# Patient Record
Sex: Female | Born: 1990 | Hispanic: No | Marital: Single | Smoking: Former smoker
Health system: Southern US, Community
[De-identification: ages and names within clinical notes are randomized; demographics above are authoritative.]

## PROBLEM LIST (undated history)

## (undated) ENCOUNTER — Inpatient Hospital Stay (HOSPITAL_COMMUNITY): Payer: Self-pay

## (undated) DIAGNOSIS — A609 Anogenital herpesviral infection, unspecified: Secondary | ICD-10-CM

## (undated) DIAGNOSIS — Z789 Other specified health status: Secondary | ICD-10-CM

## (undated) HISTORY — PX: INDUCED ABORTION: SHX677

## (undated) HISTORY — PX: ANTERIOR CRUCIATE LIGAMENT REPAIR: SHX115

## (undated) HISTORY — DX: Anogenital herpesviral infection, unspecified: A60.9

---

## 2014-07-09 ENCOUNTER — Encounter (HOSPITAL_COMMUNITY): Payer: Self-pay | Admitting: Emergency Medicine

## 2014-07-09 ENCOUNTER — Emergency Department (HOSPITAL_COMMUNITY)
Admission: EM | Admit: 2014-07-09 | Discharge: 2014-07-09 | Disposition: A | Discharge status: Home / Self Care | Payer: No Typology Code available for payment source | Attending: Emergency Medicine | Admitting: Emergency Medicine

## 2014-07-09 DIAGNOSIS — Y9389 Activity, other specified: Secondary | ICD-10-CM | POA: Diagnosis not present

## 2014-07-09 DIAGNOSIS — S0990XA Unspecified injury of head, initial encounter: Secondary | ICD-10-CM | POA: Insufficient documentation

## 2014-07-09 DIAGNOSIS — Y92410 Unspecified street and highway as the place of occurrence of the external cause: Secondary | ICD-10-CM | POA: Insufficient documentation

## 2014-07-09 DIAGNOSIS — R51 Headache: Secondary | ICD-10-CM

## 2014-07-09 DIAGNOSIS — Z720 Tobacco use: Secondary | ICD-10-CM | POA: Insufficient documentation

## 2014-07-09 DIAGNOSIS — R519 Headache, unspecified: Secondary | ICD-10-CM

## 2014-07-09 DIAGNOSIS — Y998 Other external cause status: Secondary | ICD-10-CM | POA: Insufficient documentation

## 2014-07-09 MED ORDER — TRAMADOL HCL 50 MG PO TABS: 50.0000 mg | ORAL_TABLET | Freq: Four times a day (QID) | ORAL | Status: DC | PRN

## 2014-07-09 MED ORDER — CYCLOBENZAPRINE HCL 10 MG PO TABS: 10.0000 mg | ORAL_TABLET | Freq: Two times a day (BID) | ORAL | Status: DC | PRN

## 2014-07-09 MED ORDER — IBUPROFEN 600 MG PO TABS: 600.0000 mg | ORAL_TABLET | Freq: Four times a day (QID) | ORAL | Status: DC | PRN
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
## 2014-11-18 ENCOUNTER — Encounter (HOSPITAL_COMMUNITY): Payer: Self-pay | Admitting: *Deleted

## 2014-11-18 ENCOUNTER — Emergency Department (HOSPITAL_COMMUNITY)
Admission: EM | Admit: 2014-11-18 | Discharge: 2014-11-18 | Disposition: A | Discharge status: Home / Self Care | Payer: 59 | Source: Home / Self Care | Attending: Family Medicine | Admitting: Family Medicine

## 2014-11-18 DIAGNOSIS — L089 Local infection of the skin and subcutaneous tissue, unspecified: Secondary | ICD-10-CM

## 2014-11-18 MED ORDER — SULFAMETHOXAZOLE-TRIMETHOPRIM 800-160 MG PO TABS: 1.0000 | ORAL_TABLET | Freq: Two times a day (BID) | ORAL | Status: AC

## 2014-11-18 NOTE — Discharge Instructions (Signed)
Wound Infection °A wound infection happens when a type of germ (bacteria) starts growing in the wound. In some cases, this can cause the wound to break open. If cared for properly, the infected wound will heal from the inside to the outside. Wound infections need treatment. °CAUSES °An infection is caused by bacteria growing in the wound.  °SYMPTOMS  °· Increase in redness, swelling, or pain at the wound site. °· Increase in drainage at the wound site. °· Wound or bandage (dressing) starts to smell bad. °· Fever. °· Feeling tired or fatigued. °· Pus draining from the wound. °TREATMENT  °Your health care provider will prescribe antibiotic medicine. The wound infection should improve within 24 to 48 hours. Any redness around the wound should stop spreading and the wound should be less painful.  °HOME CARE INSTRUCTIONS  °· Only take over-the-counter or prescription medicines for pain, discomfort, or fever as directed by your health care provider. °· Take your antibiotics as directed. Finish them even if you start to feel better. °· Gently wash the area with mild soap and water 2 times a day, or as directed. Rinse off the soap. Pat the area dry with a clean towel. Do not rub the wound. This may cause bleeding. °· Follow your health care provider's instructions for how often you need to change the dressing. °· Apply ointment and a dressing to the wound as directed. °· If the dressing sticks, moisten it with soapy water and gently remove it. °· Change the bandage right away if it becomes wet, dirty, or develops a bad smell. °· Take showers. Do not take tub baths, swim, or do anything that may soak the wound until it is healed. °· Avoid exercises that make you sweat heavily. °· Use anti-itch medicine as directed by your health care provider. The wound may itch when it is healing. Do not pick or scratch at the wound. °· Follow up with your health care provider to get your wound rechecked as directed. °SEEK MEDICAL CARE  IF: °· You have an increase in swelling, pain, or redness around the wound. °· You have an increase in the amount of pus coming from the wound. °· There is a bad smell coming from the wound. °· More of the wound breaks open. °· You have a fever. °MAKE SURE YOU:  °· Understand these instructions. °· Will watch your condition. °· Will get help right away if you are not doing well or get worse. °Document Released: 01/19/2003 Document Revised: 04/27/2013 Document Reviewed: 08/26/2010 °ExitCare® Patient Information ©2015 [employer]. This information is not intended to replace advice given to you by your health care provider. Make sure you discuss any questions you have with your health care provider. ° °

## 2014-11-18 NOTE — ED Provider Notes (Signed)
CSN: [id number]     Arrival date & time 11/18/14  1300 History   First MD Initiated Contact with Patient 11/18/14 1319     Chief Complaint  Patient presents with  . Abscess   (Consider location/radiation/quality/duration/timing/severity/associated sxs/prior Treatment) Patient is a 24 y.o. female presenting with abscess. The history is provided by the patient. No language interpreter was used.  Abscess Location:  Shoulder/arm Shoulder/arm abscess location:  L shoulder and L arm Size:  2cm Abscess quality: draining, redness and warmth   Red streaking: no   Duration:  3 days Progression:  Worsening Chronicity:  New Relieved by:  Nothing Worsened by:  Nothing tried Ineffective treatments:  None tried Associated symptoms: no fever     History reviewed. No pertinent past medical history. Past Surgical History  Procedure Laterality Date  . Anterior cruciate ligament repair     History reviewed. No pertinent family history. History  Substance Use Topics  . Smoking status: Current Every Day Smoker -- 0.50 packs/day    Types: Cigarettes  . Smokeless tobacco: Never Used  . Alcohol Use: Yes     Comment: occ   OB History    No data available     Review of Systems  Constitutional: Negative for fever.  Skin: Positive for wound.  All other systems reviewed and are negative.   Allergies  Review of patient's allergies indicates no known allergies.  Home Medications   Prior to Admission medications   Medication Sig Start Date End Date Taking? Authorizing Provider  cyclobenzaprine (FLEXERIL) 10 MG tablet Take 1 tablet (10 mg total) by mouth 2 (two) times daily as needed for muscle spasms. 07/09/14   Junius FinnerErin O'Malley, PA-C  ibuprofen (ADVIL,MOTRIN) 600 MG tablet Take 1 tablet (600 mg total) by mouth every 6 (six) hours as needed. 07/09/14   Junius FinnerErin O'Malley, PA-C  sulfamethoxazole-trimethoprim (BACTRIM DS,SEPTRA DS) 800-160 MG per tablet Take 1 tablet by mouth 2 (two) times daily. 11/18/14  11/25/14  Elson AreasLeslie K Sofia, PA-C  traMADol (ULTRAM) 50 MG tablet Take 1 tablet (50 mg total) by mouth every 6 (six) hours as needed. 07/09/14   Junius FinnerErin O'Malley, PA-C   BP 144/75 mmHg  Pulse 83  Temp(Src) 98.3 F (36.8 C) (Oral)  Resp 12  SpO2 100%  LMP 11/13/2014 Physical Exam  Constitutional: She is oriented to person, place, and time. She appears well-developed and well-nourished.  HENT:  Head: Normocephalic.  Eyes: EOM are normal.  Neck: Normal range of motion.  Pulmonary/Chest: Effort normal.  Abdominal: She exhibits no distension.  Musculoskeletal: Normal range of motion.  Neurological: She is alert and oriented to person, place, and time.  Skin: There is erythema.  1 2cm swollen red area left arm,  Multiple small pustulesscattered on arms  Psychiatric: She has a normal mood and affect.  Nursing note and vitals reviewed.   ED Course  Procedures (including critical care time) Labs Review Labs Reviewed - No data to display  Imaging Review No results found.   MDM   1. Skin infection    Bactrim Return if any problems.  Lonia SkinnerLeslie K [place] AlbanySofia, PA-C 11/18/14 1352

## 2014-11-18 NOTE — ED Notes (Signed)
Pt  Has  A   Boil/ abcess     L  Upper  Arm           X  3  Days  -      Also    Reports   Symptoms   Of  Smaller  Bumps  To     The  r   Arm          As  Well   Pt  Had  Tattoos   Of  Both arms  Done    About  12  Days  Ago

## 2015-05-01 ENCOUNTER — Encounter (HOSPITAL_COMMUNITY): Payer: Self-pay | Admitting: *Deleted

## 2015-05-01 ENCOUNTER — Emergency Department (HOSPITAL_COMMUNITY)
Admission: EM | Admit: 2015-05-01 | Discharge: 2015-05-01 | Disposition: A | Discharge status: Home / Self Care | Payer: 59 | Attending: Emergency Medicine | Admitting: Emergency Medicine

## 2015-05-01 DIAGNOSIS — S199XXA Unspecified injury of neck, initial encounter: Secondary | ICD-10-CM | POA: Insufficient documentation

## 2015-05-01 DIAGNOSIS — F17210 Nicotine dependence, cigarettes, uncomplicated: Secondary | ICD-10-CM | POA: Insufficient documentation

## 2015-05-01 DIAGNOSIS — Y9389 Activity, other specified: Secondary | ICD-10-CM | POA: Insufficient documentation

## 2015-05-01 DIAGNOSIS — Y92410 Unspecified street and highway as the place of occurrence of the external cause: Secondary | ICD-10-CM | POA: Insufficient documentation

## 2015-05-01 DIAGNOSIS — Y998 Other external cause status: Secondary | ICD-10-CM | POA: Insufficient documentation

## 2015-05-01 DIAGNOSIS — M791 Myalgia, unspecified site: Secondary | ICD-10-CM

## 2015-05-01 MED ORDER — IBUPROFEN 400 MG PO TABS
600.0000 mg | ORAL_TABLET | Freq: Once | ORAL | Status: AC
  Administered 2015-05-01: 600 mg via ORAL
  Filled 2015-05-01: qty 1

## 2015-05-01 MED ORDER — CYCLOBENZAPRINE HCL 10 MG PO TABS: 10.0000 mg | ORAL_TABLET | Freq: Two times a day (BID) | ORAL | Status: DC | PRN
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
## 2015-05-01 NOTE — ED Notes (Signed)
Declined W/C at D/C and was escorted to lobby by RN. 
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
## 2015-11-05 ENCOUNTER — Emergency Department (HOSPITAL_COMMUNITY)
Admission: EM | Admit: 2015-11-05 | Discharge: 2015-11-05 | Disposition: A | Discharge status: Home / Self Care | Payer: No Typology Code available for payment source | Attending: Emergency Medicine | Admitting: Emergency Medicine

## 2015-11-05 ENCOUNTER — Encounter (HOSPITAL_COMMUNITY): Payer: Self-pay | Admitting: *Deleted

## 2015-11-05 DIAGNOSIS — F17210 Nicotine dependence, cigarettes, uncomplicated: Secondary | ICD-10-CM | POA: Insufficient documentation

## 2015-11-05 DIAGNOSIS — Z79899 Other long term (current) drug therapy: Secondary | ICD-10-CM | POA: Insufficient documentation

## 2015-11-05 DIAGNOSIS — N750 Cyst of Bartholin's gland: Secondary | ICD-10-CM | POA: Insufficient documentation

## 2015-11-05 DIAGNOSIS — A599 Trichomoniasis, unspecified: Secondary | ICD-10-CM | POA: Insufficient documentation

## 2015-11-05 LAB — URINALYSIS, ROUTINE W REFLEX MICROSCOPIC
BILIRUBIN URINE: NEGATIVE
Glucose, UA: NEGATIVE mg/dL
Hgb urine dipstick: NEGATIVE
KETONES UR: NEGATIVE mg/dL
Nitrite: NEGATIVE
Protein, ur: NEGATIVE mg/dL
Specific Gravity, Urine: 1.021 (ref 1.005–1.030)
pH: 6.5 (ref 5.0–8.0)

## 2015-11-05 LAB — WET PREP, GENITAL
Clue Cells Wet Prep HPF POC: NONE SEEN
Sperm: NONE SEEN
YEAST WET PREP: NONE SEEN

## 2015-11-05 LAB — URINE MICROSCOPIC-ADD ON

## 2015-11-05 LAB — POC URINE PREG, ED: Preg Test, Ur: NEGATIVE

## 2015-11-05 MED ORDER — AZITHROMYCIN 500 MG PO TABS: 1000.0000 mg | ORAL_TABLET | Freq: Once | ORAL | Status: DC

## 2015-11-05 MED ORDER — LIDOCAINE HCL (PF) 1 % IJ SOLN
0.9000 mL | Freq: Once | INTRAMUSCULAR | Status: DC
  Filled 2015-11-05: qty 5

## 2015-11-05 MED ORDER — CEFTRIAXONE SODIUM 250 MG IJ SOLR
250.0000 mg | Freq: Once | INTRAMUSCULAR | Status: AC
  Administered 2015-11-05: 250 mg via INTRAMUSCULAR
  Filled 2015-11-05: qty 250

## 2015-11-05 MED ORDER — LIDOCAINE HCL (PF) 1 % IJ SOLN
0.9000 mL | Freq: Once | INTRAMUSCULAR | Status: AC
  Administered 2015-11-05: 0.9 mL
  Filled 2015-11-05: qty 2

## 2015-11-05 MED ORDER — METRONIDAZOLE 500 MG PO TABS
2000.0000 mg | ORAL_TABLET | Freq: Once | ORAL | Status: AC
  Administered 2015-11-05: 2000 mg via ORAL
  Filled 2015-11-05: qty 4

## 2015-11-05 NOTE — ED Provider Notes (Signed)
CSN: [id number]     Arrival date & time 11/05/15  [id number] History   First MD Initiated Contact with Patient 11/05/15 1008     Chief Complaint  Patient presents with  . Vaginal Discharge   (Consider location/radiation/quality/duration/timing/severity/associated sxs/prior Treatment) HPI 25 y.o. female presents to the Emergency Department today complaining of vaginal discharge and swelling x 4-5 days. States discharge is white in coloration. Pain worsened with sitting down. Pain is 7/10. Pain lessened with ambulation. Has taken Ibuprofen with relief of pain. Pt is sexually active with one partner. LMP Last month. Has not had any fevers. No N/V/D. No CP/SOB/ABD pain. No headaches. No other symptoms noted.   History reviewed. No pertinent past medical history. Past Surgical History  Procedure Laterality Date  . Anterior cruciate ligament repair     No family history on file. Social History  Substance Use Topics  . Smoking status: Current Every Day Smoker -- 0.50 packs/day    Types: Cigarettes  . Smokeless tobacco: Never Used  . Alcohol Use: Yes     Comment: occ   OB History    No data available     Review of Systems ROS reviewed and all are negative for acute change except as noted in the HPI.  Allergies  Review of patient's allergies indicates no known allergies.  Home Medications   Prior to Admission medications   Medication Sig Start Date End Date Taking? Authorizing Provider  cyclobenzaprine (FLEXERIL) 10 MG tablet Take 1 tablet (10 mg total) by mouth 2 (two) times daily as needed for muscle spasms. 07/09/14   Junius FinnerErin O'Malley, PA-C  cyclobenzaprine (FLEXERIL) 10 MG tablet Take 1 tablet (10 mg total) by mouth 2 (two) times daily as needed for muscle spasms. 05/01/15   Samantha Tripp Dowless, PA-C  ibuprofen (ADVIL,MOTRIN) 600 MG tablet Take 1 tablet (600 mg total) by mouth every 6 (six) hours as needed. 07/09/14   Junius FinnerErin O'Malley, PA-C  traMADol (ULTRAM) 50 MG tablet Take 1 tablet (50 mg  total) by mouth every 6 (six) hours as needed. 07/09/14   Junius FinnerErin O'Malley, PA-C   BP 127/73 mmHg  Pulse 75  Temp(Src) 97.6 F (36.4 C) (Oral)  Resp 18  SpO2 100%  LMP 10/23/2015   Physical Exam  Constitutional: She is oriented to person, place, and time. She appears well-developed and well-nourished.  HENT:  Head: Normocephalic and atraumatic.  Eyes: EOM are normal. Pupils are equal, round, and reactive to light.  Neck: Normal range of motion. Neck supple. No tracheal deviation present.  Cardiovascular: Normal rate, regular rhythm, normal heart sounds and intact distal pulses.   No murmur heard. Pulmonary/Chest: Effort normal and breath sounds normal. No respiratory distress. She has no wheezes. She has no rales. She exhibits no tenderness.  Abdominal: Soft. Normal appearance and bowel sounds are normal. There is no tenderness. There is no rigidity, no rebound, no guarding, no tenderness at McBurney's point and negative Murphy's sign.  Genitourinary: Vagina normal and uterus normal. There is no tenderness on the right labia. There is no tenderness on the left labia. Cervix exhibits discharge (white). Cervix exhibits no motion tenderness and no friability. Right adnexum displays no mass, no tenderness and no fullness. Left adnexum displays no mass, no tenderness and no fullness.  Unilateral (left) swelling of labia. Non TTP. No erythema. No drainage.   Musculoskeletal: Normal range of motion.  Neurological: She is alert and oriented to person, place, and time.  Skin: Skin is warm and dry.  Psychiatric:  She has a normal mood and affect. Her behavior is normal. Thought content normal.  Nursing note and vitals reviewed.  ED Course  Procedures (including critical care time) Labs Review Labs Reviewed  WET PREP, GENITAL - Abnormal; Notable for the following:    Trich, Wet Prep PRESENT (*)    WBC, Wet Prep HPF POC MANY (*)    All other components within normal limits  URINALYSIS, ROUTINE W  REFLEX MICROSCOPIC (NOT AT [facility]) - Abnormal; Notable for the following:    APPearance CLOUDY (*)    Leukocytes, UA SMALL (*)    All other [facility] Care[facility]onents within normal limits  URINE MICROSCOPIC-ADD ON - Abnormal; Notable for the following:    Squamous Epithelial / LPF 0-5 (*)    Bacteria, UA FEW (*)    All other components within normal limits  POC URINE PREG, ED  GC/CHLAMYDIA PROBE AMP ([place]) NOT AT [facility]   Imaging Review No results found. I have personally reviewed and evaluated these images and lab results as part of my medical decision-making.   EKG Interpretation None     MDM  I have reviewed and evaluated the relevant laboratory values I have reviewed the relevant previous healthcare records. I obtained HPI from historian.  ED Course:  Assessment: Pt is a 25yF who presents with vaginal discharge x 4-5 days. On exam, pt in NAD. Nontoxic/nonseptic appearing. VSS. Afebrile. Lungs CTA. Heart RRR. Abdomen nontender soft. Pelvic exam with white discharge present. Unilateral labial swelling of left side. Non TTP. No erythema Wet Prep showed WBC and Trichamonas. Will treat with Flagyl 2g/Rocephin 250. Given Rx Azithro. UA unremarkable. Preg negative. Plan is to DC home with follow [facility] to [facility] for bartholin cyst. At time of discharge, Patient is in no acute distress. Vital Signs are stable. Patient is able to ambulate. Patient able to tolerate PO.    Disposition/Plan:  Dc Home Additional Verbal discharge instructions given and discussed with patient.  Pt Instructed to [facility] the next week for evaluation and treatment of symptoms. Return precautions given Pt acknowledges and agrees with plan  Supervising Physician Doug SouSam Jacubowitz, MD    Final diagnoses:  Trichimoniasis  Bartholin cyst     Audry Piliyler Tykeria Wawrzyniak, PA-C 11/05/15 1155  Doug SouSam Jacubowitz, MD 11/05/15 [phone]

## 2015-11-05 NOTE — ED Notes (Signed)
Lidocaine requested from pharmacy

## 2015-11-05 NOTE — ED Notes (Signed)
Pt presents via POV c/o vaginal swelling and pain x 4-5 days.  Reports white discharge as well.  A x 4, NAD.

## 2015-11-05 NOTE — Discharge Instructions (Signed)
Please read and follow all provided instructions.  Your diagnoses today include:  1. Trichimoniasis   2. Bartholin cyst    Tests performed today include:  Test for gonorrhea and chlamydia. You will be notified by telephone if you have a positive result.  Vital signs. See below for your results today.   Medications:  You were treated for chlamydia (1 gram azithromycin pills) and gonorrhea (250mg  rocephin shot). I have given you Flagyl as well in the ED.   Home care instructions:  Read educational materials contained in this packet and follow any instructions provided.   You should tell your partners about your infection and avoid having sex for one week to allow time for the medicine to work.  Follow-up instructions: You should [facility] to be tested for HIV, syphilis, and hepatitis -- all of which can be transmitted by sexual contact. We do not routinely screen for these in the Emergency Department.  STD Testing:  [facility] [place] [place] [facility]  [address], [place], phone [phone] or [phone]    Monday - Friday, call for an appointment  [facility] [place] [place] [facility]  [address], [place], phone [phone] or [phone]   Monday - Friday, call for an appointment  Return instructions:   Please return to the Emergency Department if you experience worsening symptoms.   Please return if you have any other emergent concerns.  Additional Information:  Your vital signs today were: BP 127/73 mmHg   Pulse 75   Temp(Src) 97.6 F (36.4 C) (Oral)   Resp 18   SpO2 100%   LMP 10/23/2015 If your blood pressure (BP) was elevated above 135/85 this visit, please have this repeated by your doctor within one month. --------------

## 2015-11-06 LAB — GC/CHLAMYDIA PROBE AMP (~~LOC~~) NOT AT ARMC
Chlamydia: NEGATIVE
Neisseria Gonorrhea: POSITIVE — AB

## 2015-11-07 ENCOUNTER — Telehealth (HOSPITAL_BASED_OUTPATIENT_CLINIC_OR_DEPARTMENT_OTHER): Payer: Self-pay | Admitting: Emergency Medicine

## 2015-11-09 ENCOUNTER — Telehealth: Payer: Self-pay | Admitting: *Deleted

## 2015-11-12 ENCOUNTER — Telehealth (HOSPITAL_BASED_OUTPATIENT_CLINIC_OR_DEPARTMENT_OTHER): Payer: Self-pay

## 2016-02-28 ENCOUNTER — Encounter (HOSPITAL_COMMUNITY): Payer: Self-pay

## 2016-02-28 ENCOUNTER — Inpatient Hospital Stay (HOSPITAL_COMMUNITY)
Admission: AD | Admit: 2016-02-28 | Discharge: 2016-02-28 | Disposition: A | Discharge status: Home / Self Care | Payer: Self-pay | Attending: Family Medicine | Admitting: Family Medicine

## 2016-02-28 ENCOUNTER — Inpatient Hospital Stay (HOSPITAL_COMMUNITY)
Admission: AD | Admit: 2016-02-28 | Discharge: 2016-02-28 | Disposition: A | Discharge status: Home / Self Care | Payer: Self-pay | Source: Ambulatory Visit | Attending: Family Medicine | Admitting: Family Medicine

## 2016-02-28 DIAGNOSIS — N390 Urinary tract infection, site not specified: Secondary | ICD-10-CM | POA: Insufficient documentation

## 2016-02-28 DIAGNOSIS — R112 Nausea with vomiting, unspecified: Secondary | ICD-10-CM | POA: Insufficient documentation

## 2016-02-28 DIAGNOSIS — F17210 Nicotine dependence, cigarettes, uncomplicated: Secondary | ICD-10-CM | POA: Insufficient documentation

## 2016-02-28 DIAGNOSIS — R1030 Lower abdominal pain, unspecified: Secondary | ICD-10-CM | POA: Insufficient documentation

## 2016-02-28 DIAGNOSIS — Z79899 Other long term (current) drug therapy: Secondary | ICD-10-CM | POA: Insufficient documentation

## 2016-02-28 LAB — WET PREP, GENITAL
Clue Cells Wet Prep HPF POC: NONE SEEN
SPERM: NONE SEEN
TRICH WET PREP: NONE SEEN
YEAST WET PREP: NONE SEEN

## 2016-02-28 LAB — COMPREHENSIVE METABOLIC PANEL
ALT: 14 U/L (ref 14–54)
AST: 18 U/L (ref 15–41)
Albumin: 4.2 g/dL (ref 3.5–5.0)
Alkaline Phosphatase: 69 U/L (ref 38–126)
Anion gap: 9 (ref 5–15)
BUN: 10 mg/dL (ref 6–20)
CHLORIDE: 100 mmol/L — AB (ref 101–111)
CO2: 28 mmol/L (ref 22–32)
CREATININE: 0.71 mg/dL (ref 0.44–1.00)
Calcium: 9.3 mg/dL (ref 8.9–10.3)
GFR calc non Af Amer: >60 mL/min (ref >60)
Glucose, Bld: 119 mg/dL — ABNORMAL HIGH (ref 65–99)
Potassium: 3.5 mmol/L (ref 3.5–5.1)
SODIUM: 137 mmol/L (ref 135–145)
Total Bilirubin: 0.4 mg/dL (ref 0.3–1.2)
Total Protein: 7.8 g/dL (ref 6.5–8.1)

## 2016-02-28 LAB — CBC
HCT: 39.2 % (ref 36.0–46.0)
Hemoglobin: 13.6 g/dL (ref 12.0–15.0)
MCH: 30.6 pg (ref 26.0–34.0)
MCHC: 34.7 g/dL (ref 30.0–36.0)
MCV: 88.3 fL (ref 78.0–100.0)
PLATELETS: 290 10*3/uL (ref 150–400)
RBC: 4.44 MIL/uL (ref 3.87–5.11)
RDW: 13.1 % (ref 11.5–15.5)
WBC: 9.3 10*3/uL (ref 4.0–10.5)

## 2016-02-28 LAB — URINALYSIS, ROUTINE W REFLEX MICROSCOPIC
BILIRUBIN URINE: NEGATIVE
Glucose, UA: NEGATIVE mg/dL
Ketones, ur: 15 mg/dL — AB
Nitrite: NEGATIVE
PH: 7 (ref 5.0–8.0)
Protein, ur: 30 mg/dL — AB
Specific Gravity, Urine: 1.02 (ref 1.005–1.030)

## 2016-02-28 LAB — URINE MICROSCOPIC-ADD ON

## 2016-02-28 LAB — POCT PREGNANCY, URINE: Preg Test, Ur: NEGATIVE

## 2016-02-28 MED ORDER — PROMETHAZINE HCL 25 MG PO TABS: 12.5000 mg | ORAL_TABLET | Freq: Four times a day (QID) | ORAL | 0 refills | Status: DC | PRN

## 2016-02-28 MED ORDER — SULFAMETHOXAZOLE-TRIMETHOPRIM 800-160 MG PO TABS: 1.0000 | ORAL_TABLET | Freq: Two times a day (BID) | ORAL | 0 refills | Status: DC

## 2016-02-28 NOTE — MAU Provider Note (Signed)
Chief Complaint: No chief complaint on file.   First Provider Initiated Contact with Patient 02/28/16 1751      SUBJECTIVE HPI: Shannon Frye is a 25 y.o. G1P0010 who presents to maternity admissions reporting onset of lower abdominal cramping pain and n/v 3-4 days ago, worsening today. She also reports irregular menses this month with 3 periods in last 30 days.  She usually has regular periods. She has not tried any treatments, the symptoms are all new onset, intermittent, and resolving spontaneously. She denies pain or nausea now in MAU. She denies vaginal itching/burning, urinary symptoms, h/a, dizziness, or fever/chills.     HPI  History reviewed. No pertinent past medical history. Past Surgical History:  Procedure Laterality Date  . ANTERIOR CRUCIATE LIGAMENT REPAIR     Social History   Social History  . Marital status: Single    Spouse name: N/A  . Number of children: N/A  . Years of education: N/A   Occupational History  . Not on file.   Social History Main Topics  . Smoking status: Current Every Day Smoker    Packs/day: 0.50    Types: Cigarettes  . Smokeless tobacco: Never Used  . Alcohol use Yes     Comment: occ  . Drug use:     Types: Marijuana     Comment: used today  . Sexual activity: Yes    Birth control/ protection: Condom   Other Topics Concern  . Not on file   Social History Narrative  . No narrative on file   No current facility-administered medications on file prior to encounter.    Current Outpatient Prescriptions on File Prior to Encounter  Medication Sig Dispense Refill  . ibuprofen (ADVIL,MOTRIN) 200 MG tablet Take 400 mg by mouth every 8 (eight) hours as needed for mild pain or moderate pain.    Marland Kitchen azithromycin (ZITHROMAX) 500 MG tablet Take 2 tablets (1,000 mg total) by mouth once. Take 2 tablets together (Patient not taking: Reported on 02/28/2016) 2 tablet 0   No Known Allergies  ROS:  Review of Systems  Constitutional: Negative  for chills, fatigue and fever.  Respiratory: Negative for shortness of breath.   Cardiovascular: Negative for chest pain.  Gastrointestinal: Positive for abdominal pain.  Genitourinary: Positive for pelvic pain and vaginal bleeding. Negative for difficulty urinating, dysuria, flank pain, vaginal discharge and vaginal pain.  Neurological: Negative for dizziness and headaches.  Psychiatric/Behavioral: Negative.      I have reviewed patient's Past Medical Hx, Surgical Hx, Family Hx, Social Hx, medications and allergies.   Physical Exam  Patient Vitals for the past 24 hrs:  BP Temp Temp src Pulse Resp SpO2 Height Weight  02/28/16 1824 129/78 98.1 F (36.7 C) Oral 94 16 - - -  02/28/16 1532 121/72 98.5 F (36.9 C) Oral (!) 50 17 100 % 5\' 2"  (1.575 m) 106 lb (48.1 kg)   Constitutional: Well-developed, well-nourished female in no acute distress.  Cardiovascular: normal rate Respiratory: normal effort GI: Abd soft, non-tender. Pos BS x 4 MS: Extremities nontender, no edema, normal ROM Neurologic: Alert and oriented x 4.  GU: Neg CVAT.  PELVIC EXAM: wet prep and GCC collected by blind swab Bimanual exam: Cervix 0/long/high, firm, anterior, neg CMT, uterus nontender, nonenlarged, adnexa without tenderness, enlargement, or mass   LAB RESULTS Results for orders placed or performed during the hospital encounter of 02/28/16 (from the past 24 hour(s))  Urinalysis, Routine w reflex microscopic (not at [facility])  Status: Abnormal   Collection Time: 02/28/16  3:35 PM  Result Value Ref Range   Color, Urine YELLOW YELLOW   APPearance HAZY (A) CLEAR   Specific Gravity, Urine 1.020 1.005 - 1.030   pH 7.0 5.0 - 8.0   Glucose, UA NEGATIVE NEGATIVE mg/dL   Hgb urine dipstick SMALL (A) NEGATIVE   Bilirubin Urine NEGATIVE NEGATIVE   Ketones, ur 15 (A) NEGATIVE mg/dL   Protein, ur 30 (A) NEGATIVE mg/dL   Nitrite NEGATIVE NEGATIVE   Leukocytes, UA MODERATE (A) NEGATIVE  Urine microscopic-add on      Status: Abnormal   Collection Time: 02/28/16  3:35 PM  Result Value Ref Range   Squamous Epithelial / LPF 0-5 (A) NONE SEEN   WBC, UA 6-30 0 - 5 WBC/hpf   RBC / HPF 0-5 0 - 5 RBC/hpf   Bacteria, UA RARE (A) NONE SEEN   Crystals CA OXALATE CRYSTALS (A) NEGATIVE   Urine-Other MUCOUS PRESENT   Pregnancy, urine POC     Status: None   Collection Time: 02/28/16  4:04 PM  Result Value Ref Range   Preg Test, Ur NEGATIVE NEGATIVE  CBC     Status: None   Collection Time: 02/28/16  5:23 PM  Result Value Ref Range   WBC 9.3 4.0 - 10.5 K/uL   RBC 4.44 3.87 - 5.11 MIL/uL   Hemoglobin 13.6 12.0 - 15.0 g/dL   HCT 16.139.2 09.636.0 - 04.546.0 %   MCV 88.3 78.0 - 100.0 fL   MCH 30.6 26.0 - 34.0 pg   MCHC 34.7 30.0 - 36.0 g/dL   RDW 40.913.1 81.111.5 - 91.415.5 %   Platelets 290 150 - 400 K/uL  Comprehensive metabolic panel     Status: Abnormal   Collection Time: 02/28/16  5:23 PM  Result Value Ref Range   Sodium 137 135 - 145 mmol/L   Potassium 3.5 3.5 - 5.1 mmol/L   Chloride 100 (L) 101 - 111 mmol/L   CO2 28 22 - 32 mmol/L   Glucose, Bld 119 (H) 65 - 99 mg/dL   BUN 10 6 - 20 mg/dL   Creatinine, Ser 7.820.71 0.44 - 1.00 mg/dL   Calcium 9.3 8.9 - 95.610.3 mg/dL   Total Protein 7.8 6.5 - 8.1 g/dL   Albumin 4.2 3.5 - 5.0 g/dL   AST 18 15 - 41 U/L   ALT 14 14 - 54 U/L   Alkaline Phosphatase 69 38 - 126 U/L   Total Bilirubin 0.4 0.3 - 1.2 mg/dL   GFR calc non Af Amer >60 >60 mL/min   GFR calc Af Amer >60 >60 mL/min   Anion gap 9 5 - 15  Wet prep, genital     Status: Abnormal   Collection Time: 02/28/16  5:40 PM  Result Value Ref Range   Yeast Wet Prep HPF POC NONE SEEN NONE SEEN   Trich, Wet Prep NONE SEEN NONE SEEN   Clue Cells Wet Prep HPF POC NONE SEEN NONE SEEN   WBC, Wet Prep HPF POC MODERATE (A) NONE SEEN   Sperm NONE SEEN        IMAGING No results found.  MAU Management/MDM: Ordered labs and reviewed results.  No evidence of acute abdomen or systemic infection.  Will treat for UTI, urine sent for  culture.  Bactrim DS BID x 3 days, Phenergan 12.5-25 mg PO Q 6 hours PRN.  [facility] for Gyn concerns, MAU for emergencies.  Pt stable at time of  discharge.  ASSESSMENT 1. Urinary tract infection without hematuria, site unspecified   2. Non-intractable vomiting with nausea, unspecified vomiting type     PLAN Discharge home   Medication List    STOP taking these medications   azithromycin 500 MG tablet Commonly known as:  ZITHROMAX     TAKE these medications   ibuprofen 200 MG tablet Commonly known as:  ADVIL,MOTRIN Take 400 mg by mouth every 8 (eight) hours as needed for mild pain or moderate pain.   promethazine 25 MG tablet Commonly known as:  PHENERGAN Take 0.5-1 tablets (12.5-25 mg total) by mouth every 6 (six) hours as needed for nausea.   sulfamethoxazole-trimethoprim 800-160 MG tablet Commonly known as:  BACTRIM DS,SEPTRA DS Take 1 tablet by mouth 2 (two) times daily.      Follow-up Information    Center for [facility]. Schedule an appointment as soon as possible for a visit today.   Specialty:  Obstetrics and Gynecology Why:  As needed for Gyn care, return to MAU as needed for emergencies Contact information: [address] [place] [address] [phone]          Sharen Counter Certified Nurse-Midwife 02/28/2016  6:29 PM

## 2016-02-28 NOTE — Discharge Instructions (Signed)

## 2016-02-28 NOTE — MAU Note (Signed)
Pt c/o abdominal pain starting Saturday and continuing until today. Pt states she has been having night sweats. Pt states she hasn't been able to keep anything down since last night.Pt states she vomits everything she eats. Pt Pt states her periods have been irregular. Pt states she has been having more than one period in a month. Pt states sometimes her periods are really heavy.

## 2016-02-29 LAB — GC/CHLAMYDIA PROBE AMP (~~LOC~~) NOT AT ARMC
Chlamydia: NEGATIVE
NEISSERIA GONORRHEA: POSITIVE — AB

## 2016-02-29 LAB — URINE CULTURE

## 2016-03-03 ENCOUNTER — Telehealth (HOSPITAL_COMMUNITY): Payer: Self-pay | Admitting: Advanced Practice Midwife

## 2016-03-03 NOTE — Telephone Encounter (Signed)
Left message to call back for positive Gonorrhea result Report [facility]

## 2016-03-04 ENCOUNTER — Telehealth (HOSPITAL_COMMUNITY): Payer: Self-pay | Admitting: *Deleted

## 2016-03-19 ENCOUNTER — Ambulatory Visit: Payer: Self-pay | Admitting: Family Medicine

## 2016-06-23 ENCOUNTER — Encounter (HOSPITAL_COMMUNITY): Payer: Self-pay

## 2016-06-23 ENCOUNTER — Inpatient Hospital Stay (HOSPITAL_COMMUNITY)
Admission: AD | Admit: 2016-06-23 | Discharge: 2016-06-23 | Disposition: A | Discharge status: Home / Self Care | Payer: Self-pay | Source: Ambulatory Visit | Attending: Obstetrics & Gynecology | Admitting: Obstetrics & Gynecology

## 2016-06-23 DIAGNOSIS — Z30015 Encounter for initial prescription of vaginal ring hormonal contraceptive: Secondary | ICD-10-CM | POA: Insufficient documentation

## 2016-06-23 DIAGNOSIS — Z202 Contact with and (suspected) exposure to infections with a predominantly sexual mode of transmission: Secondary | ICD-10-CM | POA: Insufficient documentation

## 2016-06-23 DIAGNOSIS — F17210 Nicotine dependence, cigarettes, uncomplicated: Secondary | ICD-10-CM | POA: Insufficient documentation

## 2016-06-23 LAB — URINALYSIS, ROUTINE W REFLEX MICROSCOPIC
Bilirubin Urine: NEGATIVE
Glucose, UA: NEGATIVE mg/dL
Ketones, ur: 15 mg/dL — AB
Leukocytes, UA: NEGATIVE
Nitrite: NEGATIVE
Protein, ur: NEGATIVE mg/dL
Specific Gravity, Urine: >1.03 — ABNORMAL HIGH (ref 1.005–1.030)
pH: 6 (ref 5.0–8.0)

## 2016-06-23 LAB — URINALYSIS, MICROSCOPIC (REFLEX)

## 2016-06-23 LAB — WET PREP, GENITAL
CLUE CELLS WET PREP: NONE SEEN
SPERM: NONE SEEN
TRICH WET PREP: NONE SEEN
YEAST WET PREP: NONE SEEN

## 2016-06-23 LAB — POCT PREGNANCY, URINE: Preg Test, Ur: NEGATIVE

## 2016-06-23 MED ORDER — ETONOGESTREL-ETHINYL ESTRADIOL 0.12-0.015 MG/24HR VA RING: VAGINAL_RING | VAGINAL | 12 refills | Status: DC

## 2016-06-23 MED ORDER — PENICILLIN G BENZATHINE 1200000 UNIT/2ML IM SUSP
2.4000 10*6.[IU] | Freq: Once | INTRAMUSCULAR | Status: AC
Start: 2016-06-23 — End: 2016-06-23
  Administered 2016-06-23: 2.4 10*6.[IU] via INTRAMUSCULAR
  Filled 2016-06-23: qty 4

## 2016-06-23 NOTE — MAU Provider Note (Signed)
Chief Complaint: Exposure to STD   SUBJECTIVE HPI: Shannon Frye is a 26 y.o. G1P0010 at Unknown who presents to Maternity Admissions reporting exposure to STI (syphilis).  Patient states her boyfriend told her today that he tested positive for syphilis and she needs to be treated. Patient states she denies any ulcers or lesions, denies VB or VD. Patient is requesting to have all STI panel done. She also is not on contraception, but desires to start. She would like the Depo shot but also interested in the NuvaRing. Her last unprotected intercourse was 2 weeks ago, with LMP 2 weeks ago.     History reviewed. No pertinent past medical history. OB History  Gravida Para Term Preterm AB Living  1       1    SAB TAB Ectopic Multiple Live Births    1          # Outcome Date GA Lbr Len/2nd Weight Sex Delivery Anes PTL Lv  1 TAB              Past Surgical History:  Procedure Laterality Date  . ANTERIOR CRUCIATE LIGAMENT REPAIR     Social History   Social History  . Marital status: Single    Spouse name: N/A  . Number of children: N/A  . Years of education: N/A   Occupational History  . Not on file.   Social History Main Topics  . Smoking status: Current Every Day Smoker    Packs/day: 0.50    Types: Cigarettes  . Smokeless tobacco: Never Used  . Alcohol use Yes     Comment: occ  . Drug use: Yes    Types: Marijuana     Comment: used today  . Sexual activity: Yes    Birth control/ protection: Condom   Other Topics Concern  . Not on file   Social History Narrative  . No narrative on file   No current facility-administered medications on file prior to encounter.    Current Outpatient Prescriptions on File Prior to Encounter  Medication Sig Dispense Refill  . ibuprofen (ADVIL,MOTRIN) 200 MG tablet Take 400 mg by mouth every 8 (eight) hours as needed for mild pain or moderate pain.     No Known Allergies  I have reviewed the past Medical Hx, Surgical Hx, Social Hx,  Allergies and Medications.   REVIEW OF SYSTEMS  RESPIRATORY: no cough, shortness of breath, or wheezing CARDIOVASCULAR: no chest pain or dyspnea on exertion GASTROINTESTINAL: no abdominal pain, change in bowel habits, or black or bloody stools GENITO-URINARY: no dysuria, trouble voiding, or hematuria negative for - genital discharge, vulvar/vaginal symptoms or vaginal bleeding MUSKULOSKELETAL: negative for - gait disturbance or swelling in ankle - bilateral, foot - bilateral and leg - bilateral NEUROLOGICAL: negative for - dizziness, gait disturbance, headaches, numbness/tingling or visual changes DERMATOLOGICAL: negative   OBJECTIVE Patient Vitals for the past 24 hrs:  BP Temp Temp src Pulse Resp  06/23/16 1054 119/66 97.9 F (36.6 C) Oral 60 18    PHYSICAL EXAM Constitutional: Well-developed, well-nourished female in no acute distress.  Cardiovascular: normal rate, rhythm, no murmurs Respiratory: normal rate and effort. CTAB GI: Abd soft, non-tender, non-distended. Pos BS x 4 MS: Extremities nontender, no edema, normal ROM Neurologic: Alert and oriented x 4.  GU: Neg CVAT. SPECULUM EXAM: NEFG- no lesions, physiologic discharge, no blood noted, cervix clean BIMANUAL: cervix closed; uterus normal size, no adnexal tenderness or masses. No CMT.  LAB RESULTS Results for orders  placed or performed during the hospital encounter of 06/23/16 (from the past 24 hour(s))  Pregnancy, urine POC     Status: None   Collection Time: 06/23/16 10:50 AM  Result Value Ref Range   Preg Test, Ur NEGATIVE NEGATIVE    IMAGING No results found.  MAU COURSE UPT negative SSE/SVE Pen G IM 2.4 million units  MDM Plan of care reviewed with patient, including labs and tests ordered and medical treatment. Discussed all birth control options, patient elects to have NuvaRing, will follow up with health department for repeat testing in 6 months and for birth control if wants to switch to Depo.     ASSESSMENT 1. Exposure to syphilis   2. Encounter for initial prescription of vaginal ring hormonal contraceptive     PLAN Discharge home in stable condition. Contraception choice: NuvaRing Follow up with health department for repeat testing in 6 months  Follow-up Information    [facility]. Schedule an appointment as soon as possible for a visit in 6 month(s).   Why:  Or sooner. Repeat STD testing. Also follow up for management of contraception. Contact information: [address] [place] [id number] [phone]          Allergies as of 06/23/2016   No Known Allergies     Medication List    TAKE these medications   etonogestrel-ethinyl estradiol 0.12-0.015 MG/24HR vaginal ring Commonly known as:  NUVARING Insert vaginally and leave in place for 3 consecutive weeks, then remove for 1 week.   ibuprofen 200 MG tablet Commonly known as:  ADVIL,MOTRIN Take 400 mg by mouth every 8 (eight) hours as needed for mild pain or moderate pain.        Jen MowElizabeth Magdaleno Lortie, DO OB Fellow 06/23/2016 11:26 AM

## 2016-06-23 NOTE — Discharge Instructions (Signed)
Syphilis Syphilis is an infectious disease. It can cause serious complications if left untreated. What are the causes? Syphilis is caused by a type of bacteria called Treponema pallidum. It is most commonly spread through sexual contact. Syphilis may also spread to a fetus through the blood of the mother. What are the signs or symptoms? Symptoms vary depending on the stage of the disease. Some symptoms may disappear without treatment. However, this does not mean that the infection is gone. One form of syphilis (called latent syphilis) has no symptoms. Primary Syphilis  Painless sores (chancres) in and around the genital organs and mouth.  Swollen lymph nodes near the sores. Secondary Syphilis  A rash or sores over any portion of the body, including the palms of the hands and soles of the feet.  Fever.  Headache.  Sore throat.  Swollen lymph nodes.  New sores in the mouth or on the genitals.  Feeling generally ill.  Having pain in the joints. Tertiary Syphilis  The third stage of syphilis involves severe damage to different organs in the body, such as the brain, spinal cord, and heart. Signs and symptoms may include:  Dementia.  Personality and mood changes.  Difficulty walking.  Heart failure.  Fainting.  Enlargement (aneurysm) of the aorta.  Tumors of the skin, bones, or liver.  Muscle weakness.  Sudden lightning pains, numbness, or tingling.  Problems with coordination.  Vision changes. How is this diagnosed?  A physical exam will be done.  Blood tests will be done to confirm the diagnosis.  If the disease is in the first or second stages, a fluid (drainage) sample from a sore or rash may be examined under a microscope to detect the disease-causing bacteria.  Fluid around the spine may need to be examined to detect brain damage or inflammation of the brain lining (meningitis).  If the disease is in the third stage, X-rays, CT scans, MRIs,  echocardiograms, ultrasounds, or cardiac catheterization may also be done to detect disease of the heart, aorta, or brain. How is this treated? Syphilis can be cured with antibiotic medicine if a diagnosis is made early. During the first day of treatment, you may experience fever, chills, headache, nausea, or aching all over your body. This is a normal reaction to the antibiotics. Follow these instructions at home:  Take your antibiotic medicine as directed by your health care provider. Finish the antibiotic even if you start to feel better. Incomplete treatment will put you at risk for continued infection and could be life threatening.  Take medicines only as directed by your health care provider.  Do not have sexual intercourse until your treatment is completed or as directed by your health care provider.  Inform your recent sexual partners that you were diagnosed with syphilis. They need to seek care and treatment, even if they have no symptoms. It is necessary that all your sexual partners be tested for infection and treated if they have the disease.  Keep all follow-up visits as directed by your health care provider. It is important to keep all your appointments.  If your test results are not ready during your visit, make an appointment with your health care provider to find out the results. Do not assume everything is normal if you have not heard from your health care provider or the medical facility. It is your responsibility to get your test results. Contact a health care provider if:  You continue to have any of the following 24 hours after beginning  treatment:  Fever.  Chills.  Headache.  Nausea.  Aching all over your body.  You have symptoms of an allergic reaction to medicine, such as:  Chills.  A headache.  Light-headedness.  A new rash (especially hives).  Difficulty breathing. This information is not intended to replace advice given to you by your health care  provider. Make sure you discuss any questions you have with your health care provider. Document Released: 02/10/2013 Document Revised: 09/28/2015 Document Reviewed: 11/10/2012 Elsevier Interactive Patient Education  2017 [employer] Inc[employer]  Estradiol vaginal ring (Estring) What is this medicine? ESTRADIOL (es tra DYE ole) vaginal ring is an insert that contains a female hormone. This medicine helps relieve symptoms of vaginal irritation and dryness that occurs in some women during menopause. This medicine may be used for other purposes; ask your health care provider or pharmacist if you have questions. COMMON BRAND NAME(S): Estring What should I tell my health care provider before I take this medicine? They need to know if you have any of these conditions: -abnormal vaginal bleeding -blood vessel disease or blood clots -breast, cervical, endometrial, ovarian, liver, or uterine cancer -dementia -diabetes -gallbladder disease -heart disease or recent heart attack -high blood pressure -high cholesterol -high level of calcium in the blood -hysterectomy -kidney disease -liver disease -migraine headaches -protein C deficiency -protein S deficiency -stroke -systemic lupus erythematosus (SLE) -tobacco smoker -an unusual or allergic reaction to estrogens, other hormones, medicines, foods, dyes, or preservatives -pregnant or trying to get pregnant -breast-feeding How should I use this medicine? This medicine may be inserted by you or your physician. Follow the directions that are included with your prescription. If you are unsure how to insert the ring, contact your doctor or health care professional. The vaginal ring should remain in place for 90 days. After 90 days you should replace your old ring and insert a new one. Do not stop using except on the advice of your doctor or health care professional. Contact your pediatrician regarding the use of this medicine in children. Special care may be  needed. A patient package insert for the product will be given with each prescription and refill. Read this sheet carefully each time. The sheet may change frequently. Overdosage: If you think you have taken too much of this medicine contact a poison control center or emergency room at once. NOTE: This medicine is only for you. Do not share this medicine with others. What if I miss a dose? If you miss a dose, use it as soon as you can. If it is almost time for your next dose, use only that dose. Do not use double or extra doses. What may interact with this medicine? Do not take this medicine with any of the following medications: -aromatase inhibitors like aminoglutethimide, anastrozole, exemestane, letrozole, testolactone, vorozole This medicine may also interact with the following medications: -carbamazepine -certain antibiotics used to treat infections -certain barbiturates used for inducing sleep or treating seizures -grapefruit juice -medicines for fungus infections like itraconazole and ketoconazole -raloxifene or tamoxifen -rifabutin, rifampin, or rifapentine -ritonavir -St. John's Wort This list may not describe all possible interactions. Give your health care provider a list of all the medicines, herbs, non-prescription drugs, or dietary supplements you use. Also tell them if you smoke, drink alcohol, or use illegal drugs. Some items may interact with your medicine. What should I watch for while using this medicine? Visit your doctor or health care professional for regular checks on your progress. You will need a regular  breast and pelvic exam and Pap smear while on this medicine. You should also discuss the need for regular mammograms with your health care professional, and follow his or her guidelines for these tests. This medicine can make your body retain fluid, making your fingers, hands, or ankles swell. Your blood pressure can go up. Contact your doctor or health care  professional if you feel you are retaining fluid. If you have any reason to think you are pregnant, stop taking this medicine right away and contact your doctor or health care professional. Smoking increases the risk of getting a blood clot or having a stroke while you are taking this medicine, especially if you are more than 25 years old. You are strongly advised not to smoke. If you wear contact lenses and notice visual changes, or if the lenses begin to feel uncomfortable, consult your eye doctor or health care professional. This medicine can increase the risk of developing a condition (endometrial hyperplasia) that may lead to cancer of the lining of the uterus. Taking progestins, another hormone drug, with this medicine lowers the risk of developing this condition. Therefore, if your uterus has not been removed (by a hysterectomy), your doctor may prescribe a progestin for you to take together with your estrogen. You should know, however, that taking estrogens with progestins may have additional health risks. You should discuss the use of estrogens and progestins with your health care professional to determine the benefits and risks for you. If you are going to have surgery, you may need to stop taking this medicine. Consult your health care professional for advice before you schedule the surgery. You may bathe or participate in other activities while using this medicine. You do not need to remove the vaginal ring during sexual or other activities unless you are more comfortable doing so. Within the 90-day dosage period, you may remove the vaginal ring, rinse it with clean lukewarm (not hot or boiling) water, and re-insert the ring as needed. What side effects may I notice from receiving this medicine? Side effects that you should report to your doctor or health care professional as soon as possible: -allergic reactions like skin rash, itching or hives, swelling of the face, lips, or tongue -breast  tissue changes or discharge -signs and symptoms of a blood clot such as breathing problems; changes in vision; chest pain; severe, sudden headache; pain, swelling, warmth in the leg; trouble speaking; sudden numbness or weakness of the face, arm or leg -signs and symptoms of infection like fever or chills; vomiting; diarrhea; muscle pain; dizziness; or a red, sunburn-like rash on face and body -signs and symptoms of liver injury like dark yellow or brown urine; general ill feeling or flu-like symptoms; light-colored stools; loss of appetite; nausea; right upper belly pain; unusually weak or tired; yellowing of the eyes or skin -symptoms of bowel blockage like constipation, abdominal swelling, abdominal pain, inability to pass gas or have a bowel movement -symptoms of vaginal infection like itching, irritation or unusual discharge -unusual or increased vaginal bleeding -vaginal pain or soreness, redness, swelling Side effects that usually do not require medical attention (report to your doctor or health care professional if they continue or are bothersome): -breast tenderness -fluid retention -hair loss -headache -nausea -upset stomach -vaginal spotting This list may not describe all possible side effects. Call your doctor for medical advice about side effects. You may report side effects to FDA at 1-800-FDA-1088. Where should I keep my medicine? Keep out of the reach of  children. Store at room temperature between 15 and 25 degrees C (59 and 77 degrees F). Throw away any unused medicine after the expiration date. NOTE: This sheet is a summary. It may not cover all possible information. If you have questions about this medicine, talk to your doctor, pharmacist, or health care provider.  2017 Elsevier/Gold Standard (2014-02-14 13:20:25)   Contraception Choices Birth control (contraception) is the use of any methods or devices to stop pregnancy from happening. Below are some methods to help  avoid pregnancy. Hormonal birth control  A small tube put under the skin of the upper arm (implant). The tube can stay in place for 3 years. The implant must be taken out after 3 years.  Shots given every 3 months.  Pills taken every day.  Patches that are changed once a week.  A ring put into the vagina (vaginal ring). The ring is left in place for 3 weeks and removed for 1 week. Then, a new ring is put in the vagina.  Emergency birth control pills taken after unprotected sex (intercourse). Barrier birth control  A thin covering worn on the penis (female condom) during sex.  A soft, loose covering put into the vagina (female condom) before sex.  A rubber bowl that sits over the cervix (diaphragm). The bowl must be made for you. The bowl is put into the vagina before sex. The bowl is left in place for 6 to 8 hours after sex.  A small, soft cup that fits over the cervix (cervical cap). The cup must be made for you. The cup can be left in place for 48 hours after sex.  A sponge that is put into the vagina before sex.  A chemical that kills or stops sperm from getting into the cervix and uterus (spermicide). The chemical may be a cream, jelly, foam, or pill. Intrauterine (IUD) birth control  IUD birth control is a small, T-shaped piece of plastic. The plastic is put inside the uterus. There are 2 types of IUD:  Copper IUD. The IUD is covered in copper wire. The copper makes a fluid that kills sperm. It can stay in place for 10 years.  Hormone IUD. The hormone stops pregnancy from happening. It can stay in place for 5 years. Permanent methods  When the woman has her fallopian tubes sealed, tied, or blocked during surgery. This stops the egg from traveling to the uterus.  The doctor places a small coil or insert into each fallopian tube. This causes scar tissue to form and blocks the fallopian tubes.  When the female has the tubes that carry sperm tied off (vasectomy). Natural family  planning birth control  Natural family planning means not having sex or using barrier birth control on the days the woman could become pregnant.  Use a calendar to keep track of the length of each period and know the days she can get pregnant.  Avoid sex during ovulation.  Use a thermometer to measure body temperature. Also watch for symptoms of ovulation.  Time sex to be after the woman has ovulated. Use condoms to help protect yourself against sexually transmitted infections (STIs). Do this no matter what type of birth control you use. Talk to your doctor about which type of birth control is best for you. This information is not intended to replace advice given to you by your health care provider. Make sure you discuss any questions you have with your health care provider. Document Released: 02/17/2009 Document Revised: 09/28/2015  Document Reviewed: 11/11/2012 Elsevier Interactive Patient Education  2017 [employer]

## 2016-06-23 NOTE — MAU Note (Signed)
Pt presents to MAU for STD testing. Pt reports her ex boyfriend told her to get tested due to him testing positive for Syphilis. Pt denies any abnormal discharge or urinary symptoms.

## 2016-06-24 LAB — GC/CHLAMYDIA PROBE AMP (~~LOC~~) NOT AT ARMC
CHLAMYDIA, DNA PROBE: NEGATIVE
Neisseria Gonorrhea: NEGATIVE

## 2016-06-24 LAB — HIV ANTIBODY (ROUTINE TESTING W REFLEX): HIV Screen 4th Generation wRfx: NONREACTIVE

## 2016-06-24 LAB — RPR: RPR: NONREACTIVE

## 2017-01-23 ENCOUNTER — Ambulatory Visit (INDEPENDENT_AMBULATORY_CARE_PROVIDER_SITE_OTHER): Payer: Medicaid Other | Admitting: Obstetrics

## 2017-01-23 ENCOUNTER — Other Ambulatory Visit (HOSPITAL_COMMUNITY)
Admission: RE | Admit: 2017-01-23 | Discharge: 2017-01-23 | Disposition: A | Discharge status: Home / Self Care | Payer: Medicaid Other | Source: Ambulatory Visit | Attending: Obstetrics | Admitting: Obstetrics

## 2017-01-23 ENCOUNTER — Encounter: Payer: Self-pay | Admitting: Obstetrics

## 2017-01-23 ENCOUNTER — Telehealth: Payer: Self-pay

## 2017-01-23 VITALS — BP 125/82 | HR 59 | Wt 114.6 lb

## 2017-01-23 DIAGNOSIS — Z3482 Encounter for supervision of other normal pregnancy, second trimester: Secondary | ICD-10-CM | POA: Diagnosis not present

## 2017-01-23 DIAGNOSIS — F17210 Nicotine dependence, cigarettes, uncomplicated: Secondary | ICD-10-CM

## 2017-01-23 DIAGNOSIS — Z3480 Encounter for supervision of other normal pregnancy, unspecified trimester: Secondary | ICD-10-CM

## 2017-01-23 MED ORDER — PRENATE MINI 29-0.6-0.4-350 MG PO CAPS: 1.0000 | ORAL_CAPSULE | Freq: Every day | ORAL | 3 refills | Status: AC

## 2017-01-23 NOTE — Progress Notes (Signed)
Subjective:    Shannon Frye is being seen today for her first obstetrical visit.  This is not a planned pregnancy. She is at [redacted]w[redacted]d gestation. Her obstetrical history is significant for smoker. Relationship with FOB: significant other, not living together. Patient does intend to breast feed. Pregnancy history fully reviewed.  The information documented in the HPI was reviewed and verified.  Menstrual History: OB History    Gravida Para Term Preterm AB Living   2       1     SAB TAB Ectopic Multiple Live Births     1             Patient's last menstrual period was 09/19/2016.    History reviewed. No pertinent past medical history.  Past Surgical History:  Procedure Laterality Date  . ANTERIOR CRUCIATE LIGAMENT REPAIR       (Not in a hospital admission) No Known Allergies  Social History  Substance Use Topics  . Smoking status: Current Every Day Smoker    Packs/day: 0.50  . Smokeless tobacco: Never Used  . Alcohol use Yes     Comment: occ    Family History  Problem Relation Age of Onset  . Diabetes Mother      Review of Systems Constitutional: negative for weight loss Gastrointestinal: negative for vomiting Genitourinary:negative for genital lesions and vaginal discharge and dysuria Musculoskeletal:negative for back pain Behavioral/Psych: negative for abusive relationship, depression, illegal drug usage and tobacco use    Objective:    BP 125/82   Pulse (!) 59   Wt 114 lb 9.6 oz (52 kg)   LMP 09/19/2016   BMI 20.96 kg/m  General Appearance:    Alert, cooperative, no distress, appears stated age  Head:    Normocephalic, without obvious abnormality, atraumatic  Eyes:    PERRL, conjunctiva/corneas clear, EOM's intact, fundi    benign, both eyes  Ears:    Normal TM's and external ear canals, both ears  Nose:   Nares normal, septum midline, mucosa normal, no drainage    or sinus tenderness  Throat:   Lips, mucosa, and tongue normal; teeth and gums normal   Neck:   Supple, symmetrical, trachea midline, no adenopathy;    thyroid:  no enlargement/tenderness/nodules; no carotid   bruit or JVD  Back:     Symmetric, no curvature, ROM normal, no CVA tenderness  Lungs:     Clear to auscultation bilaterally, respirations unlabored  Chest Wall:    No tenderness or deformity   Heart:    Regular rate and rhythm, S1 and S2 normal, no murmur, rub   or gallop  Breast Exam:    No tenderness, masses, or nipple abnormality  Abdomen:     Soft, non-tender, bowel sounds active all four quadrants,    no masses, no organomegaly  Genitalia:    Normal female without lesion, discharge or tenderness  Extremities:   Extremities normal, atraumatic, no cyanosis or edema  Pulses:   2+ and symmetric all extremities  Skin:   Skin color, texture, turgor normal, no rashes or lesions  Lymph nodes:   Cervical, supraclavicular, and axillary nodes normal  Neurologic:   CNII-XII intact, normal strength, sensation and reflexes    throughout      Lab Review Urine pregnancy test Labs reviewed yes Radiologic studies reviewed yes  Assessment:    Pregnancy at [redacted]w[redacted]d weeks    Plan:     1. Supervision of other normal pregnancy, antepartum Rx: - Cytology -  PAP - Cervicovaginal ancillary only - Culture, OB Urine - Hemoglobinopathy evaluation - Vitamin D (25 hydroxy) - Hemoglobin A1c - Varicella zoster antibody, IgG - Enroll Patient in Babyscripts - Prenat w/o A-FeCbn-Meth-FA-DHA (PRENATE MINI) 29-0.6-0.4-350 MG CAPS; Take 1 capsule by mouth daily before breakfast.  Dispense: 90 capsule; Refill: 3 - [place] [facility] OB COMP + 14 WK; Future  2. Tobacco dependence due to cigarettes   Prenatal vitamins.  Counseling provided regarding continued use of seat belts, cessation of alcohol consumption, smoking or use of illicit drugs; infection precautions i.e., influenza/TDAP immunizations, toxoplasmosis,CMV, parvovirus, listeria and varicella; workplace safety, exercise during pregnancy;  routine dental care, safe medications, sexual activity, hot tubs, saunas, pools, travel, caffeine use, fish and methlymercury, potential toxins, hair treatments, varicose veins Weight gain recommendations per IOM guidelines reviewed: underweight/BMI< 18.5--> gain 28 - 40 lbs; normal weight/BMI 18.5 - 24.9--> gain 25 - 35 lbs; overweight/BMI 25 - 29.9--> gain 15 - 25 lbs; obese/BMI >30->gain  11 - 20 lbs Problem list reviewed and updated. FIRST/CF mutation testing/NIPT/QUAD SCREEN/fragile X/Ashkenazi Jewish population testing/Spinal muscular atrophy discussed: requested. Role of ultrasound in pregnancy discussed; fetal survey: requested. Amniocentesis discussed: not indicated  Meds ordered this encounter  Medications  . Prenatal Vit-Fe Fumarate-FA (PRENATAL MULTIVITAMIN) TABS tablet    Sig: Take 1 tablet by mouth daily at 12 noon.  . Prenat w/o A-FeCbn-Meth-FA-DHA (PRENATE MINI) 29-0.6-0.4-350 MG CAPS    Sig: Take 1 capsule by mouth daily before breakfast.    Dispense:  90 capsule    Refill:  3   Orders Placed This Encounter  Procedures  . Culture, OB Urine  . [place] MFM OB COMP + 14 WK    Standing Status:   Future    Standing Expiration Date:   03/25/2018    Order Specific Question:   Reason for Exam (SYMPTOM  OR DIAGNOSIS REQUIRED)    Answer:   Anatomy    Order Specific Question:   Preferred imaging location?    Answer:   [facility]-Ultrasound  . Hemoglobinopathy evaluation  . Vitamin D (25 hydroxy)  . Hemoglobin A1c  . Varicella zoster antibody, IgG    Follow up in 4 weeks. 50% of 20 min visit spent on counseling and coordination of care. Patient is in the office for initial ob visit, states that she is unsure if she feels movement or not, no complaints.

## 2017-01-23 NOTE — Telephone Encounter (Signed)
Contacted pt because she left [facility] appt without getting labs done, woman answered and stated that she would have pt call back.

## 2017-01-23 NOTE — Addendum Note (Signed)
Addended by: Natale Milch D on: 01/23/2017 04:28 PM   Modules accepted: Orders

## 2017-01-24 ENCOUNTER — Inpatient Hospital Stay (HOSPITAL_COMMUNITY)
Admission: AD | Admit: 2017-01-24 | Discharge: 2017-01-25 | Disposition: A | Discharge status: Home / Self Care | Payer: Medicaid Other | Source: Ambulatory Visit | Attending: Obstetrics and Gynecology | Admitting: Obstetrics and Gynecology

## 2017-01-24 ENCOUNTER — Other Ambulatory Visit: Payer: Self-pay | Admitting: Obstetrics

## 2017-01-24 DIAGNOSIS — Z3A18 18 weeks gestation of pregnancy: Secondary | ICD-10-CM

## 2017-01-24 DIAGNOSIS — O9989 Other specified diseases and conditions complicating pregnancy, childbirth and the puerperium: Secondary | ICD-10-CM | POA: Diagnosis not present

## 2017-01-24 DIAGNOSIS — O99332 Smoking (tobacco) complicating pregnancy, second trimester: Secondary | ICD-10-CM | POA: Insufficient documentation

## 2017-01-24 DIAGNOSIS — Z202 Contact with and (suspected) exposure to infections with a predominantly sexual mode of transmission: Secondary | ICD-10-CM

## 2017-01-24 DIAGNOSIS — A5901 Trichomonal vulvovaginitis: Secondary | ICD-10-CM | POA: Insufficient documentation

## 2017-01-24 DIAGNOSIS — F17210 Nicotine dependence, cigarettes, uncomplicated: Secondary | ICD-10-CM | POA: Insufficient documentation

## 2017-01-24 DIAGNOSIS — Z3492 Encounter for supervision of normal pregnancy, unspecified, second trimester: Secondary | ICD-10-CM

## 2017-01-24 DIAGNOSIS — O98312 Other infections with a predominantly sexual mode of transmission complicating pregnancy, second trimester: Secondary | ICD-10-CM | POA: Insufficient documentation

## 2017-01-24 HISTORY — DX: Other specified health status: Z78.9

## 2017-01-24 LAB — CERVICOVAGINAL ANCILLARY ONLY
Bacterial vaginitis: NEGATIVE
CANDIDA VAGINITIS: NEGATIVE
CHLAMYDIA, DNA PROBE: NEGATIVE
Neisseria Gonorrhea: NEGATIVE
Trichomonas: POSITIVE — AB

## 2017-01-24 MED ORDER — METRONIDAZOLE 500 MG PO TABS: 2000.0000 mg | ORAL_TABLET | Freq: Once | ORAL | 0 refills | Status: DC

## 2017-01-24 NOTE — MAU Note (Signed)
Think I have STD. Boyfriend told me he was treated for poss herpes outbreak. I have like a rash on my vagina for 3 days. Is not painful. Some white vag d/c. No pain

## 2017-01-25 ENCOUNTER — Encounter (HOSPITAL_COMMUNITY): Payer: Self-pay | Admitting: *Deleted

## 2017-01-25 DIAGNOSIS — O9989 Other specified diseases and conditions complicating pregnancy, childbirth and the puerperium: Secondary | ICD-10-CM

## 2017-01-25 DIAGNOSIS — Z202 Contact with and (suspected) exposure to infections with a predominantly sexual mode of transmission: Secondary | ICD-10-CM | POA: Diagnosis not present

## 2017-01-25 DIAGNOSIS — Z3A18 18 weeks gestation of pregnancy: Secondary | ICD-10-CM | POA: Diagnosis not present

## 2017-01-25 LAB — URINALYSIS, ROUTINE W REFLEX MICROSCOPIC
BILIRUBIN URINE: NEGATIVE
GLUCOSE, UA: NEGATIVE mg/dL
HGB URINE DIPSTICK: NEGATIVE
Ketones, ur: NEGATIVE mg/dL
NITRITE: NEGATIVE
PROTEIN: NEGATIVE mg/dL
Specific Gravity, Urine: 1.016 (ref 1.005–1.030)
pH: 6 (ref 5.0–8.0)

## 2017-01-25 LAB — URINE CULTURE, OB REFLEX

## 2017-01-25 LAB — CULTURE, OB URINE

## 2017-01-25 MED ORDER — METRONIDAZOLE 500 MG PO TABS: 2000.0000 mg | ORAL_TABLET | Freq: Once | ORAL | 0 refills | Status: AC

## 2017-01-25 NOTE — Discharge Instructions (Signed)

## 2017-01-25 NOTE — Progress Notes (Signed)
Erin Lawrence NP in to discuss test result and d/c plan with pt. Written and verbal d/c instructions given and understanding voiced 

## 2017-01-25 NOTE — MAU Provider Note (Signed)
History     CSN: [id number]  Arrival date and time: 01/24/17 2338  First Provider Initiated Contact with Patient 01/25/17 0055      Chief Complaint  Patient presents with  . possible STD   HPI Shannon Frye is a 26 y.o. G2P0010 at [redacted]w[redacted]d who presents for STD exposure. States her BF was treated for HSV today. She has noted a possible lesion on her right labia. Denies abdominal pain, vaginal bleeding, or vaginal discharge.  OB History    Gravida Para Term Preterm AB Living   2       1     SAB TAB Ectopic Multiple Live Births     1            Past Medical History:  Diagnosis Date  . Medical history non-contributory     Past Surgical History:  Procedure Laterality Date  . ANTERIOR CRUCIATE LIGAMENT REPAIR    . INDUCED ABORTION      Family History  Problem Relation Age of Onset  . Diabetes Mother     Social History  Substance Use Topics  . Smoking status: Current Every Day Smoker    Packs/day: 0.50  . Smokeless tobacco: Never Used  . Alcohol use Yes     Comment: occ    Allergies: No Known Allergies  No prescriptions prior to admission.    Review of Systems  Gastrointestinal: Negative.   Genitourinary: Positive for genital sores. Negative for vaginal bleeding and vaginal discharge.   Physical Exam   Blood pressure 124/73, pulse 63, temperature 98.2 F (36.8 C), resp. rate 16, height  (1.575 m), weight 119 lb (54 kg), last menstrual period 09/19/2016.  Physical Exam  Nursing note and vitals reviewed. Constitutional: She is oriented to person, place, and time. She appears well-developed and well-nourished. No distress.  HENT:  Head: Normocephalic and atraumatic.  Eyes: Conjunctivae are normal. Right eye exhibits no discharge. Left eye exhibits no discharge. No scleral icterus.  Neck: Normal range of motion.  Respiratory: Effort normal. No respiratory distress.  Genitourinary: There is lesion (small ulcerated lesion of mid right labia minora) on  the right labia.  Neurological: She is alert and oriented to person, place, and time.  Skin: Skin is warm and dry. She is not diaphoretic.  Psychiatric: She has a normal mood and affect. Her behavior is normal. Judgment and thought content normal.    MAU Course  Procedures Results for orders placed or performed during the hospital encounter of 01/24/17 (from the past 24 hour(s))  Urinalysis, Routine w reflex microscopic     Status: Abnormal   Collection Time: 01/24/17 11:55 PM  Result Value Ref Range   Color, Urine YELLOW YELLOW   APPearance HAZY (A) CLEAR   Specific Gravity, Urine 1.016 1.005 - 1.030   pH 6.0 5.0 - 8.0   Glucose, UA NEGATIVE NEGATIVE mg/dL   Hgb urine dipstick NEGATIVE NEGATIVE   Bilirubin Urine NEGATIVE NEGATIVE   Ketones, ur NEGATIVE NEGATIVE mg/dL   Protein, ur NEGATIVE NEGATIVE mg/dL   Nitrite NEGATIVE NEGATIVE   Leukocytes, UA TRACE (A) NEGATIVE   RBC / HPF 0-5 0 - 5 RBC/hpf   WBC, UA 6-30 0 - 5 WBC/hpf   Bacteria, UA FEW (A) NONE SEEN   Squamous Epithelial / LPF 0-5 (A) NONE SEEN   Mucus PRESENT    Amorphous Crystal PRESENT     MDM FHT 150 Per review of records; pt had OB appt earlier this week. GC/CT  negative. Trich +. Flagyl was called into pharmacy but patient had not been notified yet. Notified pt of + trich during this visit. Patient opted to fill rx that has already been sent in for her & take tx at home.  Labial lesion x 1, HSV culture obtained  Assessment and Plan  A: 1. Fetal heart tones present, second trimester   2. Possible exposure to STD   3. [redacted] weeks gestation of pregnancy   4. Trichomonas contact, untreated    P: Discharge home Take flagyl as prescribed No intercourse x 1 week after treatment HSV culture pending  Judeth Horn 01/25/2017, 12:55 AM

## 2017-01-27 ENCOUNTER — Telehealth: Payer: Self-pay

## 2017-01-27 ENCOUNTER — Other Ambulatory Visit: Payer: Self-pay

## 2017-01-27 LAB — HSV CULTURE AND TYPING

## 2017-01-27 LAB — CYTOLOGY - PAP
Diagnosis: NEGATIVE
HPV (WINDOPATH): NOT DETECTED

## 2017-01-27 MED ORDER — METRONIDAZOLE 375 MG PO CAPS: 375.0000 mg | ORAL_CAPSULE | Freq: Two times a day (BID) | ORAL | 0 refills | Status: DC

## 2017-01-28 ENCOUNTER — Other Ambulatory Visit: Payer: Self-pay | Admitting: Obstetrics

## 2017-01-29 ENCOUNTER — Other Ambulatory Visit: Payer: Self-pay

## 2017-01-29 MED ORDER — METRONIDAZOLE 500 MG PO TABS: 1000.0000 mg | ORAL_TABLET | Freq: Once | ORAL | 0 refills | Status: AC

## 2017-01-29 NOTE — Telephone Encounter (Signed)
Patient notified of results and RX 

## 2017-01-30 ENCOUNTER — Telehealth: Payer: Self-pay | Admitting: Student

## 2017-01-30 LAB — HEMOGLOBIN A1C
Est. average glucose Bld gHb Est-mCnc: 88 mg/dL
Hgb A1c MFr Bld: 4.7 % — ABNORMAL LOW (ref 4.8–5.6)

## 2017-01-30 LAB — HEMOGLOBINOPATHY EVALUATION
HGB C: 0 %
HGB S: 0 %
HGB VARIANT: 1.5 % — AB
Hemoglobin A2 Quantitation: 1.3 % — ABNORMAL LOW (ref 1.8–3.2)
Hemoglobin F Quantitation: 0 % (ref 0.0–2.0)
Hgb A: 97.5 % (ref 96.4–98.8)

## 2017-01-30 LAB — OBSTETRIC PANEL, INCLUDING HIV
ANTIBODY SCREEN: NEGATIVE
BASOS: 0 %
Basophils Absolute: 0 10*3/uL (ref 0.0–0.2)
EOS (ABSOLUTE): 0.1 10*3/uL (ref 0.0–0.4)
Eos: 1 %
HEMATOCRIT: 34.4 % (ref 34.0–46.6)
HIV SCREEN 4TH GENERATION: NONREACTIVE
Hemoglobin: 11.7 g/dL (ref 11.1–15.9)
Hepatitis B Surface Ag: NEGATIVE
Immature Grans (Abs): 0 10*3/uL (ref 0.0–0.1)
Immature Granulocytes: 0 %
Lymphocytes Absolute: 2.6 10*3/uL (ref 0.7–3.1)
Lymphs: 36 %
MCH: 32.7 pg (ref 26.6–33.0)
MCHC: 34 g/dL (ref 31.5–35.7)
MCV: 96 fL (ref 79–97)
MONOCYTES: 5 %
MONOS ABS: 0.3 10*3/uL (ref 0.1–0.9)
NEUTROS ABS: 4.2 10*3/uL (ref 1.4–7.0)
Neutrophils: 58 %
Platelets: 308 10*3/uL (ref 150–379)
RBC: 3.58 x10E6/uL — ABNORMAL LOW (ref 3.77–5.28)
RDW: 15.1 % (ref 12.3–15.4)
RH TYPE: NEGATIVE
RPR Ser Ql: NONREACTIVE
RUBELLA: 4.42 {index} (ref >0.99)
WBC: 7.2 10*3/uL (ref 3.4–10.8)

## 2017-01-30 LAB — VARICELLA ZOSTER ANTIBODY, IGG: Varicella zoster IgG: 662 index (ref >165)

## 2017-01-30 LAB — VITAMIN D 25 HYDROXY (VIT D DEFICIENCY, FRACTURES): Vit D, 25-Hydroxy: 33.1 ng/mL (ref 30.0–100.0)

## 2017-01-30 NOTE — Telephone Encounter (Signed)
Attempted to contact pt x 2. No answer & voicemail box full.  Patient needs to be notified of +HSV results & rx treatment.  Staff message sent to her OB office to notify her at upcoming visit.

## 2017-01-31 ENCOUNTER — Ambulatory Visit (HOSPITAL_COMMUNITY)
Admission: RE | Admit: 2017-01-31 | Discharge: 2017-01-31 | Disposition: A | Discharge status: Home / Self Care | Payer: Medicaid Other | Source: Ambulatory Visit | Attending: Obstetrics | Admitting: Obstetrics

## 2017-01-31 DIAGNOSIS — Z3A19 19 weeks gestation of pregnancy: Secondary | ICD-10-CM | POA: Diagnosis not present

## 2017-01-31 DIAGNOSIS — Z3689 Encounter for other specified antenatal screening: Secondary | ICD-10-CM | POA: Diagnosis present

## 2017-01-31 DIAGNOSIS — Z3482 Encounter for supervision of other normal pregnancy, second trimester: Secondary | ICD-10-CM | POA: Diagnosis present

## 2017-01-31 DIAGNOSIS — Z3480 Encounter for supervision of other normal pregnancy, unspecified trimester: Secondary | ICD-10-CM

## 2017-02-03 ENCOUNTER — Other Ambulatory Visit: Payer: Self-pay | Admitting: Obstetrics

## 2017-02-03 DIAGNOSIS — Z09 Encounter for follow-up examination after completed treatment for conditions other than malignant neoplasm: Secondary | ICD-10-CM

## 2017-02-04 ENCOUNTER — Encounter: Payer: Self-pay | Admitting: Obstetrics and Gynecology

## 2017-02-04 ENCOUNTER — Telehealth: Payer: Self-pay

## 2017-02-04 DIAGNOSIS — O43109 Malformation of placenta, unspecified, unspecified trimester: Secondary | ICD-10-CM | POA: Insufficient documentation

## 2017-02-04 DIAGNOSIS — Q270 Congenital absence and hypoplasia of umbilical artery: Secondary | ICD-10-CM | POA: Insufficient documentation

## 2017-02-04 DIAGNOSIS — B009 Herpesviral infection, unspecified: Secondary | ICD-10-CM | POA: Insufficient documentation

## 2017-02-04 DIAGNOSIS — O98519 Other viral diseases complicating pregnancy, unspecified trimester: Secondary | ICD-10-CM

## 2017-02-04 NOTE — Telephone Encounter (Signed)
S/w patient and advised of results and need for afp tetra before 21.0 weeks, pt agreed, transferred to scheduler.

## 2017-02-05 ENCOUNTER — Other Ambulatory Visit: Payer: Medicaid Other

## 2017-02-05 DIAGNOSIS — Z3480 Encounter for supervision of other normal pregnancy, unspecified trimester: Secondary | ICD-10-CM

## 2017-02-07 LAB — AFP TETRA
DIA Mom Value: 1.03
DIA Value (EIA): 228.56 pg/mL
DSR (By Age)    1 IN: 982
DSR (Second Trimester) 1 IN: 10000
Gestational Age: 19.6 wk
MSAFP Mom: 1.33
MSAFP: 85 ng/mL
MSHCG Mom: 0.65
MSHCG: 19021 m[IU]/mL
Maternal Age At EDD: 26 a
Osb Risk: 4399
T18 (By Age): 1:3826 {titer}
Test Results:: NEGATIVE
Weight: 114 [lb_av]
uE3 Mom: 0.95
uE3 Value: 1.75 ng/mL

## 2017-02-20 ENCOUNTER — Ambulatory Visit (INDEPENDENT_AMBULATORY_CARE_PROVIDER_SITE_OTHER): Payer: Medicaid Other | Admitting: Obstetrics

## 2017-02-20 ENCOUNTER — Encounter: Payer: Self-pay | Admitting: Obstetrics

## 2017-02-20 VITALS — BP 110/68 | HR 62 | Wt 122.6 lb

## 2017-02-20 DIAGNOSIS — O43199 Other malformation of placenta, unspecified trimester: Secondary | ICD-10-CM

## 2017-02-20 DIAGNOSIS — O43892 Other placental disorders, second trimester: Secondary | ICD-10-CM

## 2017-02-20 DIAGNOSIS — Z3480 Encounter for supervision of other normal pregnancy, unspecified trimester: Secondary | ICD-10-CM

## 2017-02-20 DIAGNOSIS — O09899 Supervision of other high risk pregnancies, unspecified trimester: Secondary | ICD-10-CM

## 2017-02-20 DIAGNOSIS — O43899 Other placental disorders, unspecified trimester: Secondary | ICD-10-CM

## 2017-02-20 DIAGNOSIS — Z3482 Encounter for supervision of other normal pregnancy, second trimester: Secondary | ICD-10-CM

## 2017-02-20 NOTE — Progress Notes (Signed)
Pt states she is well at this time

## 2017-02-20 NOTE — Progress Notes (Addendum)
Subjective:  Shannon Frye is a 26 y.o. G2P0010 at 448w0d being seen today for ongoing prenatal care.  She is currently monitored for the following issues for this low-risk pregnancy and has Supervision of other normal pregnancy, antepartum; Single umbilical artery; ?bi-lobed placenta; and HSV-2 infection complicating pregnancy on her problem list.  Patient reports no complaints.  Contractions: Not present. Vag. Bleeding: None.  Movement: Present. Denies leaking of fluid.   The following portions of the patient's history were reviewed and updated as appropriate: allergies, current medications, past family history, past medical history, past social history, past surgical history and problem list. Problem list updated.  Objective:   Vitals:   02/20/17 1418  BP: 110/68  Pulse: 62  Weight: 122 lb 9.6 oz (55.6 kg)    Fetal Status: Fetal Heart Rate (bpm): 150   Movement: Present     General:  Alert, oriented and cooperative. Patient is in no acute distress.  Skin: Skin is warm and dry. No rash noted.   Cardiovascular: Normal heart rate noted  Respiratory: Normal respiratory effort, no problems with respiration noted  Abdomen: Soft, gravid, appropriate for gestational age. Pain/Pressure: Present     Pelvic:  Cervical exam deferred        Extremities: Normal range of motion.  Edema: None  Mental Status: Normal mood and affect. Normal behavior. Normal judgment and thought content.   Urinalysis:      Assessment and Plan:  Pregnancy: G2P0010 at 308w0d  1. Supervision of other normal pregnancy, antepartum   2. Single umbilical artery affecting management of mother, antepartum, single gestation.  U/S scheduled q 6 wks for growth  Rx: - US MFM OB FOLLOW UP; Future - US MFM OB FOLLOW UP; Future  3. Bilobed placenta Rx: - US MFM OB FOLLOW UP; Future - US MFM OB FOLLOW UP; Future   Preterm labor symptoms and general obstetric precautions including but not limited to vaginal bleeding,  contractions, leaking of fluid and fetal movement were reviewed in detail with the patient. Please refer to After Visit Summary for other counseling recommendations.  Return in about 4 weeks (around 03/20/2017) for ROB.   Brock BadHarper, Charles A, MD

## 2017-03-14 ENCOUNTER — Encounter (HOSPITAL_COMMUNITY): Payer: Self-pay

## 2017-03-14 ENCOUNTER — Other Ambulatory Visit: Payer: Self-pay | Admitting: Obstetrics

## 2017-03-14 ENCOUNTER — Ambulatory Visit (HOSPITAL_COMMUNITY)
Admission: RE | Admit: 2017-03-14 | Discharge: 2017-03-14 | Disposition: A | Discharge status: Home / Self Care | Payer: Medicaid Other | Source: Ambulatory Visit | Attending: Obstetrics | Admitting: Obstetrics

## 2017-03-14 DIAGNOSIS — O43199 Other malformation of placenta, unspecified trimester: Secondary | ICD-10-CM

## 2017-03-14 DIAGNOSIS — Z3A25 25 weeks gestation of pregnancy: Secondary | ICD-10-CM

## 2017-03-14 DIAGNOSIS — O09892 Supervision of other high risk pregnancies, second trimester: Secondary | ICD-10-CM | POA: Insufficient documentation

## 2017-03-14 DIAGNOSIS — O43892 Other placental disorders, second trimester: Secondary | ICD-10-CM | POA: Diagnosis not present

## 2017-03-14 DIAGNOSIS — Z362 Encounter for other antenatal screening follow-up: Secondary | ICD-10-CM

## 2017-03-14 DIAGNOSIS — O43899 Other placental disorders, unspecified trimester: Secondary | ICD-10-CM

## 2017-03-14 DIAGNOSIS — O09899 Supervision of other high risk pregnancies, unspecified trimester: Secondary | ICD-10-CM

## 2017-03-20 ENCOUNTER — Other Ambulatory Visit (HOSPITAL_COMMUNITY)
Admission: RE | Admit: 2017-03-20 | Discharge: 2017-03-20 | Disposition: A | Discharge status: Home / Self Care | Payer: Medicaid Other | Source: Ambulatory Visit | Attending: Obstetrics | Admitting: Obstetrics

## 2017-03-20 ENCOUNTER — Encounter: Payer: Self-pay | Admitting: Obstetrics

## 2017-03-20 ENCOUNTER — Ambulatory Visit (INDEPENDENT_AMBULATORY_CARE_PROVIDER_SITE_OTHER): Payer: Medicaid Other | Admitting: Obstetrics

## 2017-03-20 VITALS — BP 126/71 | HR 71 | Wt 136.8 lb

## 2017-03-20 DIAGNOSIS — Z3482 Encounter for supervision of other normal pregnancy, second trimester: Secondary | ICD-10-CM

## 2017-03-20 DIAGNOSIS — O26892 Other specified pregnancy related conditions, second trimester: Secondary | ICD-10-CM | POA: Insufficient documentation

## 2017-03-20 DIAGNOSIS — L298 Other pruritus: Secondary | ICD-10-CM | POA: Insufficient documentation

## 2017-03-20 DIAGNOSIS — Z3480 Encounter for supervision of other normal pregnancy, unspecified trimester: Secondary | ICD-10-CM

## 2017-03-20 DIAGNOSIS — Z3A26 26 weeks gestation of pregnancy: Secondary | ICD-10-CM | POA: Diagnosis not present

## 2017-03-20 DIAGNOSIS — N898 Other specified noninflammatory disorders of vagina: Secondary | ICD-10-CM

## 2017-03-20 MED ORDER — CLOTRIMAZOLE 1 % EX CREA: 1.0000 "application " | TOPICAL_CREAM | Freq: Two times a day (BID) | CUTANEOUS | 0 refills | Status: DC

## 2017-03-20 MED ORDER — FLUCONAZOLE 150 MG PO TABS: 150.0000 mg | ORAL_TABLET | Freq: Once | ORAL | 0 refills | Status: AC

## 2017-03-20 NOTE — Patient Instructions (Addendum)

## 2017-03-21 ENCOUNTER — Encounter: Payer: Self-pay | Admitting: Obstetrics

## 2017-03-21 NOTE — Progress Notes (Signed)
Subjective:  Shannon Frye is a 26 y.o. G2P0010 at 6191w1d being seen today for ongoing prenatal care.  She is currently monitored for the following issues for this low-risk pregnancy and has Supervision of other normal pregnancy, antepartum; Single umbilical artery; ?bi-lobed placenta; and HSV-2 infection complicating pregnancy on their problem list.  Patient reports vaginal irritation.  Contractions: Not present. Vag. Bleeding: None.  Movement: Present. Denies leaking of fluid.   The following portions of the patient's history were reviewed and updated as appropriate: allergies, current medications, past family history, past medical history, past social history, past surgical history and problem list. Problem list updated.  Objective:   Vitals:   03/20/17 1356  BP: 126/71  Pulse: 71  Weight: 136 lb 12.8 oz (62.1 kg)    Fetal Status: Fetal Heart Rate (bpm): 150   Movement: Present     General:  Alert, oriented and cooperative. Patient is in no acute distress.  Skin: Skin is warm and dry. No rash noted.   Cardiovascular: Normal heart rate noted  Respiratory: Normal respiratory effort, no problems with respiration noted  Abdomen: Soft, gravid, appropriate for gestational age. Pain/Pressure: Present     Pelvic:  Cervical exam deferred        Extremities: Normal range of motion.  Edema: None  Mental Status: Normal mood and affect. Normal behavior. Normal judgment and thought content.   Urinalysis:      Assessment and Plan:  Pregnancy: G2P0010 at 6391w1d  1. Supervision of other normal pregnancy, antepartum Rx: - Cervicovaginal ancillary only  2. Vagina itching Rx: - Cervicovaginal ancillary only - fluconazole (DIFLUCAN) 150 MG tablet; Take 1 tablet (150 mg total) once for 1 dose by mouth.  Dispense: 1 tablet; Refill: 0 - clotrimazole (LOTRIMIN) 1 % cream; Apply 1 application 2 (two) times daily topically.  Dispense: 60 g; Refill: 0  Preterm labor symptoms and general obstetric  precautions including but not limited to vaginal bleeding, contractions, leaking of fluid and fetal movement were reviewed in detail with the patient. Please refer to After Visit Summary for other counseling recommendations.  Return in about 2 weeks (around 04/03/2017) for ROB.   Brock BadHarper, Charles A, MD

## 2017-03-24 LAB — CERVICOVAGINAL ANCILLARY ONLY
Bacterial vaginitis: NEGATIVE
CANDIDA VAGINITIS: POSITIVE — AB
CHLAMYDIA, DNA PROBE: NEGATIVE
Neisseria Gonorrhea: NEGATIVE
Trichomonas: NEGATIVE

## 2017-03-25 ENCOUNTER — Other Ambulatory Visit: Payer: Self-pay | Admitting: Obstetrics

## 2017-03-25 DIAGNOSIS — B373 Candidiasis of vulva and vagina: Secondary | ICD-10-CM

## 2017-03-25 DIAGNOSIS — B3731 Acute candidiasis of vulva and vagina: Secondary | ICD-10-CM

## 2017-03-25 MED ORDER — FLUCONAZOLE 150 MG PO TABS: 150.0000 mg | ORAL_TABLET | Freq: Once | ORAL | 0 refills | Status: AC

## 2017-04-03 ENCOUNTER — Other Ambulatory Visit: Payer: Medicaid Other

## 2017-04-03 ENCOUNTER — Ambulatory Visit (INDEPENDENT_AMBULATORY_CARE_PROVIDER_SITE_OTHER): Payer: Medicaid Other | Admitting: Obstetrics

## 2017-04-03 ENCOUNTER — Encounter: Payer: Self-pay | Admitting: Obstetrics

## 2017-04-03 VITALS — BP 114/71 | HR 69 | Wt 136.4 lb

## 2017-04-03 DIAGNOSIS — Z3483 Encounter for supervision of other normal pregnancy, third trimester: Secondary | ICD-10-CM

## 2017-04-03 DIAGNOSIS — Z23 Encounter for immunization: Secondary | ICD-10-CM

## 2017-04-03 DIAGNOSIS — Z3490 Encounter for supervision of normal pregnancy, unspecified, unspecified trimester: Secondary | ICD-10-CM

## 2017-04-03 NOTE — Progress Notes (Signed)
Patient reports good fetal movement, denies pain. 

## 2017-04-03 NOTE — Progress Notes (Signed)
Subjective:  Shannon Frye is a 26 y.o. G2P0010 at 2129w0d being seen today for ongoing prenatal care.  She is currently monitored for the following issues for this low-risk pregnancy and has Supervision of other normal pregnancy, antepartum; Single umbilical artery; ?bi-lobed placenta; and HSV-2 infection complicating pregnancy on their problem list.  Patient reports heartburn, relieved by Tums.  Contractions: Not present. Vag. Bleeding: None.  Movement: Present. Denies leaking of fluid.   The following portions of the patient's history were reviewed and updated as appropriate: allergies, current medications, past family history, past medical history, past social history, past surgical history and problem list. Problem list updated.  Objective:   Vitals:   04/03/17 0906  BP: 114/71  Pulse: 69  Weight: 136 lb 6.4 oz (61.9 kg)    Fetal Status: Fetal Heart Rate (bpm): 150   Movement: Present     General:  Alert, oriented and cooperative. Patient is in no acute distress.  Skin: Skin is warm and dry. No rash noted.   Cardiovascular: Normal heart rate noted  Respiratory: Normal respiratory effort, no problems with respiration noted  Abdomen: Soft, gravid, appropriate for gestational age. Pain/Pressure: Absent     Pelvic:  Cervical exam deferred        Extremities: Normal range of motion.  Edema: None  Mental Status: Normal mood and affect. Normal behavior. Normal judgment and thought content.   Urinalysis:      Assessment and Plan:  Pregnancy: G2P0010 at 529w0d  1. Encounter for supervision of normal pregnancy, antepartum, unspecified gravidity Rx: - Glucose Tolerance, 2 Hours w/1 Hour - HIV antibody (with reflex) - RPR - CBC - Tdap vaccine greater than or equal to 7yo IM  2. Encounter for immunization Rx - Tdap vaccine greater than or equal to 7yo IM  Preterm labor symptoms and general obstetric precautions including but not limited to vaginal bleeding, contractions, leaking  of fluid and fetal movement were reviewed in detail with the patient. Please refer to After Visit Summary for other counseling recommendations.  Return in about 2 weeks (around 04/17/2017) for ROB.   Brock BadHarper, Paxton Kanaan A, MD

## 2017-04-04 ENCOUNTER — Other Ambulatory Visit: Payer: Self-pay | Admitting: Obstetrics

## 2017-04-04 LAB — CBC
HEMATOCRIT: 32 % — AB (ref 34.0–46.6)
HEMOGLOBIN: 10.8 g/dL — AB (ref 11.1–15.9)
MCH: 33.2 pg — AB (ref 26.6–33.0)
MCHC: 33.8 g/dL (ref 31.5–35.7)
MCV: 99 fL — ABNORMAL HIGH (ref 79–97)
Platelets: 275 10*3/uL (ref 150–379)
RBC: 3.25 x10E6/uL — AB (ref 3.77–5.28)
RDW: 13.9 % (ref 12.3–15.4)
WBC: 7.7 10*3/uL (ref 3.4–10.8)

## 2017-04-04 LAB — HIV ANTIBODY (ROUTINE TESTING W REFLEX): HIV SCREEN 4TH GENERATION: NONREACTIVE

## 2017-04-04 LAB — GLUCOSE TOLERANCE, 2 HOURS W/ 1HR
Glucose, 1 hour: 90 mg/dL (ref 65–179)
Glucose, 2 hour: 64 mg/dL — ABNORMAL LOW (ref 65–152)
Glucose, Fasting: 75 mg/dL (ref 65–91)

## 2017-04-04 LAB — RPR: RPR Ser Ql: NONREACTIVE

## 2017-04-04 MED ORDER — FERROUS SULFATE 325 (65 FE) MG PO TABS: 325.0000 mg | ORAL_TABLET | Freq: Two times a day (BID) | ORAL | 5 refills | Status: DC

## 2017-04-16 ENCOUNTER — Ambulatory Visit (HOSPITAL_COMMUNITY): Payer: No Typology Code available for payment source

## 2017-04-16 ENCOUNTER — Ambulatory Visit (INDEPENDENT_AMBULATORY_CARE_PROVIDER_SITE_OTHER): Payer: Medicaid Other | Admitting: Obstetrics

## 2017-04-16 ENCOUNTER — Encounter: Payer: Self-pay | Admitting: Obstetrics

## 2017-04-16 DIAGNOSIS — Z3480 Encounter for supervision of other normal pregnancy, unspecified trimester: Secondary | ICD-10-CM

## 2017-04-16 DIAGNOSIS — Z3483 Encounter for supervision of other normal pregnancy, third trimester: Secondary | ICD-10-CM

## 2017-04-16 NOTE — Progress Notes (Signed)
Pt denies concerns at this time. 

## 2017-04-16 NOTE — Progress Notes (Signed)
Subjective:  Shannon Frye is a 26 y.o. G2P0010 at 3480w6d being seen today for ongoing prenatal care.  She is currently monitored for the following issues for this low-risk pregnancy and has Supervision of other normal pregnancy, antepartum; Single umbilical artery; ?bi-lobed placenta; and HSV-2 infection complicating pregnancy on their problem list.  Patient reports no complaints.  Contractions: Irritability. Vag. Bleeding: None.  Movement: Present. Denies leaking of fluid.   The following portions of the patient's history were reviewed and updated as appropriate: allergies, current medications, past family history, past medical history, past social history, past surgical history and problem list. Problem list updated.  Objective:   Vitals:   04/16/17 1406  BP: 128/77  Pulse: 67  Weight: 134 lb (60.8 kg)    Fetal Status: Fetal Heart Rate (bpm): 150   Movement: Present     General:  Alert, oriented and cooperative. Patient is in no acute distress.  Skin: Skin is warm and dry. No rash noted.   Cardiovascular: Normal heart rate noted  Respiratory: Normal respiratory effort, no problems with respiration noted  Abdomen: Soft, gravid, appropriate for gestational age. Pain/Pressure: Absent     Pelvic:  Cervical exam deferred        Extremities: Normal range of motion.  Edema: None  Mental Status: Normal mood and affect. Normal behavior. Normal judgment and thought content.   Urinalysis:      Assessment and Plan:  Pregnancy: G2P0010 at 1180w6d  1. Supervision of other normal pregnancy, antepartum   Preterm labor symptoms and general obstetric precautions including but not limited to vaginal bleeding, contractions, leaking of fluid and fetal movement were reviewed in detail with the patient. Please refer to After Visit Summary for other counseling recommendations.  Return in about 2 weeks (around 04/30/2017) for ROB.   Brock BadHarper, Davier Tramell A, MD

## 2017-04-25 ENCOUNTER — Other Ambulatory Visit: Payer: Self-pay | Admitting: Obstetrics

## 2017-04-25 ENCOUNTER — Encounter (HOSPITAL_COMMUNITY): Payer: Self-pay

## 2017-04-25 ENCOUNTER — Ambulatory Visit (HOSPITAL_COMMUNITY)
Admission: RE | Admit: 2017-04-25 | Discharge: 2017-04-25 | Disposition: A | Discharge status: Home / Self Care | Payer: Medicaid Other | Source: Ambulatory Visit | Attending: Obstetrics | Admitting: Obstetrics

## 2017-04-25 DIAGNOSIS — O99333 Smoking (tobacco) complicating pregnancy, third trimester: Secondary | ICD-10-CM | POA: Insufficient documentation

## 2017-04-25 DIAGNOSIS — Z362 Encounter for other antenatal screening follow-up: Secondary | ICD-10-CM | POA: Diagnosis not present

## 2017-04-25 DIAGNOSIS — Q270 Congenital absence and hypoplasia of umbilical artery: Secondary | ICD-10-CM

## 2017-04-25 DIAGNOSIS — Z09 Encounter for follow-up examination after completed treatment for conditions other than malignant neoplasm: Secondary | ICD-10-CM

## 2017-04-25 DIAGNOSIS — Z3A31 31 weeks gestation of pregnancy: Secondary | ICD-10-CM

## 2017-04-30 ENCOUNTER — Ambulatory Visit (INDEPENDENT_AMBULATORY_CARE_PROVIDER_SITE_OTHER): Payer: Medicaid Other | Admitting: Obstetrics

## 2017-04-30 VITALS — BP 109/60 | HR 62 | Wt 140.0 lb

## 2017-04-30 DIAGNOSIS — Z3480 Encounter for supervision of other normal pregnancy, unspecified trimester: Secondary | ICD-10-CM

## 2017-04-30 DIAGNOSIS — O09899 Supervision of other high risk pregnancies, unspecified trimester: Secondary | ICD-10-CM

## 2017-04-30 DIAGNOSIS — F17210 Nicotine dependence, cigarettes, uncomplicated: Secondary | ICD-10-CM

## 2017-04-30 DIAGNOSIS — O09893 Supervision of other high risk pregnancies, third trimester: Secondary | ICD-10-CM

## 2017-04-30 NOTE — Progress Notes (Signed)
Pt states upper abd pain at night.

## 2017-05-01 ENCOUNTER — Encounter: Payer: Self-pay | Admitting: Obstetrics

## 2017-05-01 NOTE — Progress Notes (Signed)
Subjective:  Shannon Frye is a 26 y.o. G2P0010 at [redacted]w[redacted]d being seen today for ongoing prenatal care.  She is currently monitored for the following issues for this low-risk pregnancy and has Supervision of other normal pregnancy, antepartum; Single umbilical artery; ?bi-lobed placenta; and HSV-2 infection complicating pregnancy on their problem list.  Patient reports no complaints.  Contractions: Not present. Vag. Bleeding: None.  Movement: Present. Denies leaking of fluid.   The following portions of the patient's history were reviewed and updated as appropriate: allergies, current medications, past family history, past medical history, past social history, past surgical history and problem list. Problem list updated.  Objective:   Vitals:   04/30/17 1433  BP: 109/60  Pulse: 62  Weight: 140 lb (63.5 kg)    Fetal Status: Fetal Heart Rate (bpm): 150   Movement: Present     General:  Alert, oriented and cooperative. Patient is in no acute distress.  Skin: Skin is warm and dry. No rash noted.   Cardiovascular: Normal heart rate noted  Respiratory: Normal respiratory effort, no problems with respiration noted  Abdomen: Soft, gravid, appropriate for gestational age. Pain/Pressure: Present     Pelvic:  Cervical exam deferred        Extremities: Normal range of motion.     Mental Status: Normal mood and affect. Normal behavior. Normal judgment and thought content.   Urinalysis:      Assessment and Plan:  Pregnancy: G2P0010 at 762w0d  1. Supervision of other normal pregnancy, antepartum   2. Single umbilical artery affecting management of mother, antepartum, single gestation   3. Tobacco dependence due to cigarettes   Preterm labor symptoms and general obstetric precautions including but not limited to vaginal bleeding, contractions, leaking of fluid and fetal movement were reviewed in detail with the patient. Please refer to After Visit Summary for other counseling  recommendations.  Return in about 2 weeks (around 05/14/2017) for ROB.   Brock BadHarper, Charles A, MD

## 2017-05-06 NOTE — L&D Delivery Note (Signed)
Patient is 27 y.o. G2P0010 4625w1d admitted for IOL for nonreassuring NST.   Delivery Note  Patient had recurrent late decelerations prior to delivery. Pitocin was turned off about 1 hour prior to delivery and patient began pushing starting at +1/+2 station. About 10 minutes prior to delivery, baby had prolonged deceleration lasting about 3 minutes and resolved to baseline of 130 bpm. I consented patient on risks of vacuum delivery including fetal intracranial bleed and vaginal tissue damage. Patient verbally agreed to vacuum. Dr. Erin FullingHarraway-Smith was called to the room. Kiwi vacuum was placed on fetal head which was at pelvic outlet and careful to avoid maternal tissue. Attempted vacuum with 1 push and this moved baby's head well. After that, vacuum was not reapplied. Instead, patient pushed well to deliver within a few minutes. Cord pH pending. Placenta to pathology due to 1 vessel cord.  At 0522 a viable female was delivered via  SVD, Presentation: cephalic,LOA. APGAR: 8,9 ; weight pending.  Placenta status: spontaneous, intact. Cord: 1 vessel   Anesthesia:  epidural Episiotomy:  none Lacerations:  1st degree, bleeding despite pressure Suture Repair: 3-0 vicryl Est. Blood Loss (mL): 200  Mom to postpartum.  Baby to Couplet care / Skin to Skin.  Rolm BookbinderAmber Tymeer Vaquera, DO [facility]

## 2017-05-14 ENCOUNTER — Encounter: Payer: Self-pay | Admitting: Obstetrics

## 2017-05-14 ENCOUNTER — Other Ambulatory Visit: Payer: Self-pay

## 2017-05-14 ENCOUNTER — Ambulatory Visit (INDEPENDENT_AMBULATORY_CARE_PROVIDER_SITE_OTHER): Payer: Medicaid Other | Admitting: Obstetrics

## 2017-05-14 VITALS — BP 118/60 | HR 68 | Wt 139.0 lb

## 2017-05-14 DIAGNOSIS — O09899 Supervision of other high risk pregnancies, unspecified trimester: Secondary | ICD-10-CM

## 2017-05-14 DIAGNOSIS — O09893 Supervision of other high risk pregnancies, third trimester: Secondary | ICD-10-CM

## 2017-05-14 DIAGNOSIS — Z3480 Encounter for supervision of other normal pregnancy, unspecified trimester: Secondary | ICD-10-CM

## 2017-05-14 NOTE — Progress Notes (Signed)
Subjective:  Shannon Frye is a 27 y.o. G2P0010 at 569w6d being seen today for ongoing prenatal care.  She is currently monitored for the following issues for this high-risk pregnancy and has Supervision of other normal pregnancy, antepartum; Single umbilical artery; ?bi-lobed placenta; and HSV-2 infection complicating pregnancy on their problem list.  Patient reports no complaints.  Contractions: Not present. Vag. Bleeding: None.  Movement: Present. Denies leaking of fluid.   The following portions of the patient's history were reviewed and updated as appropriate: allergies, current medications, past family history, past medical history, past social history, past surgical history and problem list. Problem list updated.  Objective:   Vitals:   05/14/17 1506  BP: 118/60  Pulse: 68  Weight: 139 lb (63 kg)    Fetal Status: Fetal Heart Rate (bpm): 150   Movement: Present     General:  Alert, oriented and cooperative. Patient is in no acute distress.  Skin: Skin is warm and dry. No rash noted.   Cardiovascular: Normal heart rate noted  Respiratory: Normal respiratory effort, no problems with respiration noted  Abdomen: Soft, gravid, appropriate for gestational age. Pain/Pressure: Absent     Pelvic:  Cervical exam deferred        Extremities: Normal range of motion.  Edema: None  Mental Status: Normal mood and affect. Normal behavior. Normal judgment and thought content.   Urinalysis:      Assessment and Plan:  Pregnancy: G2P0010 at 4869w6d  1. Supervision of other normal pregnancy, antepartum  2. Single umbilical artery affecting management of mother, antepartum, single gestation - stable growth and amniotic fluid   Preterm labor symptoms and general obstetric precautions including but not limited to vaginal bleeding, contractions, leaking of fluid and fetal movement were reviewed in detail with the patient. Please refer to After Visit Summary for other counseling recommendations.   Return in about 2 weeks (around 05/28/2017) for ROB.   Brock BadHarper, Charles A, MD

## 2017-05-21 ENCOUNTER — Encounter: Payer: Medicaid Other | Admitting: Obstetrics

## 2017-05-22 ENCOUNTER — Ambulatory Visit (INDEPENDENT_AMBULATORY_CARE_PROVIDER_SITE_OTHER): Payer: Medicaid Other | Admitting: Obstetrics

## 2017-05-22 ENCOUNTER — Encounter: Payer: Self-pay | Admitting: Obstetrics

## 2017-05-22 VITALS — BP 123/78 | HR 60 | Wt 142.2 lb

## 2017-05-22 DIAGNOSIS — Z3480 Encounter for supervision of other normal pregnancy, unspecified trimester: Secondary | ICD-10-CM

## 2017-05-22 DIAGNOSIS — Z3483 Encounter for supervision of other normal pregnancy, third trimester: Secondary | ICD-10-CM

## 2017-05-22 DIAGNOSIS — F17210 Nicotine dependence, cigarettes, uncomplicated: Secondary | ICD-10-CM

## 2017-05-22 DIAGNOSIS — O09899 Supervision of other high risk pregnancies, unspecified trimester: Secondary | ICD-10-CM

## 2017-05-22 NOTE — Progress Notes (Signed)
Subjective:  Shannon Frye is a 27 y.o. G2P0010 at [redacted]w[redacted]d being seen today for ongoing prenatal care.  She is currently monitored for the following issues for this high-risk pregnancy and has Supervision of other normal pregnancy, antepartum; Single umbilical artery; ?bi-lobed placenta; and HSV-2 infection complicating pregnancy on their problem list.  Patient reports no complaints.  Contractions: Irregular. Vag. Bleeding: None.  Movement: Present. Denies leaking of fluid.   The following portions of the patient's history were reviewed and updated as appropriate: allergies, current medications, past family history, past medical history, past social history, past surgical history and problem list. Problem list updated.  Objective:   Vitals:   05/22/17 0853  BP: 123/78  Pulse: 60  Weight: 142 lb 3.2 oz (64.5 kg)    Fetal Status: Fetal Heart Rate (bpm): 150   Movement: Present     General:  Alert, oriented and cooperative. Patient is in no acute distress.  Skin: Skin is warm and dry. No rash noted.   Cardiovascular: Normal heart rate noted  Respiratory: Normal respiratory effort, no problems with respiration noted  Abdomen: Soft, gravid, appropriate for gestational age. Pain/Pressure: Absent     Pelvic:  Cervical exam deferred        Extremities: Normal range of motion.  Edema: None  Mental Status: Normal mood and affect. Normal behavior. Normal judgment and thought content.   Urinalysis:     Ultrasound for Interval Growth: [place] MFM OB FOLLOW UP (Accession [id number]) (Order [id number])  Imaging  Date: 04/25/2017 Department: [facility] MATERNAL FETAL CARE ULTRASOUND Released By: Vivien Rota, RT Authorizing: Brock Bad, MD  Exam Information   Status Exam Begun  Exam Ended   Final [99] 04/25/2017 2:22 PM 04/25/2017 4:16 PM  PACS Images   Show images for [place] MFM OB FOLLOW UP  Study Result   ----------------------------------------------------------------------   OBSTETRICS REPORT                      (Signed Final 04/25/2017 06:21 pm) ---------------------------------------------------------------------- Patient Info  ID #:       [id number]                          D.O.B.:  [birth year] (26 yrs)  Name:       Shannon Frye              Visit Date: 04/25/2017 02:18 pm ---------------------------------------------------------------------- Performed By  Performed By:     Vivien Rota        Ref. Address:     [address], [address]                                                             [place], [place]  [id number]  Attending:        Particia NearingMartha Decker MD       [facility]  Referred By:      Brock BadHARLES A                    HARPER MD ---------------------------------------------------------------------- Orders   #  Description                                 Code   1  US MFM OB FOLLOW UP                         [id number]  ----------------------------------------------------------------------   #  Ordered By               Order #        Accession #    Episode #   1  Coral CeoHARLES HARPER           [id number]      [phone]     [id number]  ---------------------------------------------------------------------- Indications   [redacted] weeks gestation of pregnancy                Z3A.31   Encounter for other antenatal screening        Z36.2   follow-up   2 vessel umbilical cord                        O69.89X0   Smoking complicating pregnancy, third          O99.333   trimester  ---------------------------------------------------------------------- OB History  Gravidity:    2  TOP:          1        Living:  0 ---------------------------------------------------------------------- Fetal Evaluation  Num Of Fetuses:     1  Fetal Heart         151  Rate(bpm):  Cardiac Activity:   Observed  Presentation:        Cephalic  Placenta:           Anterior, above cervical os  P. Cord Insertion:  Previously Visualized  Amniotic Fluid  AFI FV:      Subjectively within normal limits  AFI Sum(cm)     %Tile       Largest Pocket(cm)  23.33           92          7.43  RUQ(cm)       RLQ(cm)       LUQ(cm)        LLQ(cm)  5.47          7.43          4.05           6.38 ---------------------------------------------------------------------- Biometry  BPD:      81.1  mm     G. Age:  32w 4d         81  %    CI:        70.51   %    70 - 86  FL/HC:      19.7   %    19.3 - 21.3  HC:      307.9  mm     G. Age:  34w 3d         91  %    HC/AC:      1.13        0.96 - 1.17  AC:      272.8  mm     G. Age:  31w 3d         53  %    FL/BPD:     74.8   %    71 - 87  FL:       60.7  mm     G. Age:  31w 4d         48  %    FL/AC:      22.3   %    20 - 24  HUM:      52.5  mm     G. Age:  30w 4d         41  %  Est. FW:    1842  gm      4 lb 1 oz     68  % ---------------------------------------------------------------------- Gestational Age  LMP:           31w 1d        Date:  09/19/16                 EDD:   06/26/17  U/S Today:     32w 4d                                        EDD:   06/16/17  Best:          31w 1d     Det. By:  LMP  (09/19/16)          EDD:   06/26/17 ---------------------------------------------------------------------- Anatomy  Cranium:               Appears normal         Aortic Arch:            Previously seen  Cavum:                 Previously seen        Ductal Arch:            Previously seen  Ventricles:            Previously seen        Diaphragm:              Previously seen  Choroid Plexus:        Previously seen        Stomach:                Appears normal, left                                                                        sided  Cerebellum:  Previously seen        Abdomen:                Appears normal  Posterior Fossa:        Previously seen        Abdominal Wall:         Previously seen  Nuchal Fold:           Not applicable (>20    Cord Vessels:           2 vessel cord,                         wks GA)                                                                        absent left umb art  Face:                  Orbits and profile     Kidneys:                Appear normal                         previously seen  Lips:                  Previously seen        Bladder:                Appears normal  Thoracic:              Appears normal         Spine:                  Previously seen  Heart:                 Appears normal         Upper Extremities:      Previously seen                         (4CH, axis, and situs  RVOT:                  Appears normal         Lower Extremities:      Previously seen  LVOT:                  Appears normal  Other:  Fetus appears to be a female. Heels and 5th digit previously.          Technically difficult due to fetal position. ---------------------------------------------------------------------- Cervix Uterus Adnexa  Cervix  Not visualized (advanced GA >29wks)  Uterus  No abnormality visualized.  Left Ovary  Within normal limits.  Right Ovary  Within normal limits.  Adnexa:       No abnormality visualized. No adnexal mass                visualized. ---------------------------------------------------------------------- Impression  SIUP at 31+1 weeks  Single umbilical artery  All other interval fetal anatomy was seen and appeared  normal; anatomic survey complete  Normal amniotic fluid volume  Appropriate interval growth with EFW at the 68th %tile ---------------------------------------------------------------------- Recommendations  Follow-up as clinically indicated ----------------------------------------------------------------------                 Particia Nearing, MD Electronically Signed Final Report   04/25/2017 06:21  pm ----------------------------------------------------------------------  Kathee Delton Documents:   There are no order-level documents.  Encounter-Level Documents:   There are no encounter-level documents.  Vitals   Height Weight BMI (Calculated)  5\' 2"  (1.575 m) 142 lb 3.2 oz (64.5 kg) 22.42  Imaging   Imaging Information  Signed by   Signed Date/Time  Phone Pager  Particia Nearing 04/25/2017 6:21 PM [phone]   Signed   Electronically signed by Particia Nearing, MD on 04/25/17 at 1821 EST  Original Order   Ordered On Ordered By   02/03/2017 10:07 AM Small, Rachael H, RT         Assessment and Plan:  Pregnancy: G2P0010 at [redacted]w[redacted]d  1. Supervision of other normal pregnancy, antepartum - doing well  2. Single umbilical artery affecting management of mother, antepartum, single gestation - good interval growth scans  3. Tobacco dependence due to cigarettes   Preterm labor symptoms and general obstetric precautions including but not limited to vaginal bleeding, contractions, leaking of fluid and fetal movement were reviewed in detail with the patient. Please refer to After Visit Summary for other counseling recommendations.  Return in about 1 week (around 05/29/2017) for ROB.   Brock Bad, MD

## 2017-06-04 ENCOUNTER — Other Ambulatory Visit (HOSPITAL_COMMUNITY)
Admission: RE | Admit: 2017-06-04 | Discharge: 2017-06-04 | Disposition: A | Discharge status: Home / Self Care | Payer: Medicaid Other | Source: Ambulatory Visit | Attending: Obstetrics | Admitting: Obstetrics

## 2017-06-04 DIAGNOSIS — Z3A00 Weeks of gestation of pregnancy not specified: Secondary | ICD-10-CM | POA: Diagnosis not present

## 2017-06-04 DIAGNOSIS — Z3480 Encounter for supervision of other normal pregnancy, unspecified trimester: Secondary | ICD-10-CM | POA: Insufficient documentation

## 2017-06-05 ENCOUNTER — Encounter: Payer: Self-pay | Admitting: Obstetrics

## 2017-06-05 ENCOUNTER — Ambulatory Visit (INDEPENDENT_AMBULATORY_CARE_PROVIDER_SITE_OTHER): Payer: Medicaid Other | Admitting: Obstetrics

## 2017-06-05 VITALS — BP 114/76 | HR 69 | Wt 149.0 lb

## 2017-06-05 DIAGNOSIS — Z3483 Encounter for supervision of other normal pregnancy, third trimester: Secondary | ICD-10-CM

## 2017-06-05 DIAGNOSIS — Z3480 Encounter for supervision of other normal pregnancy, unspecified trimester: Secondary | ICD-10-CM

## 2017-06-05 LAB — OB RESULTS CONSOLE GBS: STREP GROUP B AG: POSITIVE

## 2017-06-05 LAB — OB RESULTS CONSOLE GC/CHLAMYDIA: Gonorrhea: NEGATIVE

## 2017-06-05 NOTE — Progress Notes (Signed)
Subjective:  Luciana AxeBrianna D Ozturk is a 27 y.o. G2P0010 at 8411w0d being seen today for ongoing prenatal care.  She is currently monitored for the following issues for this high-risk pregnancy and has Supervision of other normal pregnancy, antepartum; Single umbilical artery; ?bi-lobed placenta; and HSV-2 infection complicating pregnancy on their problem list.  Patient reports no complaints.  Contractions: Not present. Vag. Bleeding: None.  Movement: Present. Denies leaking of fluid.   The following portions of the patient's history were reviewed and updated as appropriate: allergies, current medications, past family history, past medical history, past social history, past surgical history and problem list. Problem list updated.  Objective:   Vitals:   06/05/17 1436  BP: 114/76  Pulse: 69  Weight: 149 lb (67.6 kg)    Fetal Status: Fetal Heart Rate (bpm): 150   Movement: Present     General:  Alert, oriented and cooperative. Patient is in no acute distress.  Skin: Skin is warm and dry. No rash noted.   Cardiovascular: Normal heart rate noted  Respiratory: Normal respiratory effort, no problems with respiration noted  Abdomen: Soft, gravid, appropriate for gestational age. Pain/Pressure: Absent     Pelvic:  Cervical exam deferred        Extremities: Normal range of motion.  Edema: None  Mental Status: Normal mood and affect. Normal behavior. Normal judgment and thought content.   Urinalysis:      Assessment and Plan:  Pregnancy: G2P0010 at 3611w0d  1. Supervision of other normal pregnancy, antepartum Rx: - Strep Gp B NAA - Cervicovaginal ancillary only  Term labor symptoms and general obstetric precautions including but not limited to vaginal bleeding, contractions, leaking of fluid and fetal movement were reviewed in detail with the patient. Please refer to After Visit Summary for other counseling recommendations.  Return in about 1 week (around 06/12/2017) for ROB.   Brock BadHarper, Charles  A, MD

## 2017-06-06 LAB — CERVICOVAGINAL ANCILLARY ONLY
Chlamydia: NEGATIVE
Neisseria Gonorrhea: NEGATIVE

## 2017-06-07 LAB — STREP GP B NAA: STREP GROUP B AG: POSITIVE — AB

## 2017-06-12 ENCOUNTER — Ambulatory Visit (INDEPENDENT_AMBULATORY_CARE_PROVIDER_SITE_OTHER): Payer: Medicaid Other | Admitting: Obstetrics

## 2017-06-12 ENCOUNTER — Encounter: Payer: Self-pay | Admitting: Obstetrics

## 2017-06-12 VITALS — BP 129/77 | HR 73 | Wt 149.8 lb

## 2017-06-12 DIAGNOSIS — Z3490 Encounter for supervision of normal pregnancy, unspecified, unspecified trimester: Secondary | ICD-10-CM

## 2017-06-12 DIAGNOSIS — Z3483 Encounter for supervision of other normal pregnancy, third trimester: Secondary | ICD-10-CM

## 2017-06-12 DIAGNOSIS — O09899 Supervision of other high risk pregnancies, unspecified trimester: Secondary | ICD-10-CM

## 2017-06-12 NOTE — Progress Notes (Signed)
Subjective:  Shannon Frye is a 27 y.o. G2P0010 at 3125w0d being seen today for ongoing prenatal care.  She is currently monitored for the following issues for this low-risk pregnancy and has Supervision of other normal pregnancy, antepartum; Single umbilical artery; ?bi-lobed placenta; and HSV-2 infection complicating pregnancy on their problem list.  Patient reports no complaints.  Contractions: Not present. Vag. Bleeding: None.  Movement: Present. Denies leaking of fluid.   The following portions of the patient's history were reviewed and updated as appropriate: allergies, current medications, past family history, past medical history, past social history, past surgical history and problem list. Problem list updated.  Objective:   Vitals:   06/12/17 1438  BP: 129/77  Pulse: 73  Weight: 149 lb 12.8 oz (67.9 kg)    Fetal Status: Fetal Heart Rate (bpm): 150   Movement: Present     General:  Alert, oriented and cooperative. Patient is in no acute distress.  Skin: Skin is warm and dry. No rash noted.   Cardiovascular: Normal heart rate noted  Respiratory: Normal respiratory effort, no problems with respiration noted  Abdomen: Soft, gravid, appropriate for gestational age. Pain/Pressure: Absent     Pelvic:  Cervical exam deferred        Extremities: Normal range of motion.  Edema: None  Mental Status: Normal mood and affect. Normal behavior. Normal judgment and thought content.   Urinalysis:      Assessment and Plan:  Pregnancy: G2P0010 at 5125w0d  1. Encounter for supervision of normal pregnancy, antepartum, unspecified gravidity  2. Single umbilical artery affecting management of mother, antepartum, single gestation   Term labor symptoms and general obstetric precautions including but not limited to vaginal bleeding, contractions, leaking of fluid and fetal movement were reviewed in detail with the patient. Please refer to After Visit Summary for other counseling  recommendations.  Return in about 1 week (around 06/19/2017) for ROB.   Brock BadHarper, Charles A, MD

## 2017-06-19 ENCOUNTER — Encounter: Payer: Self-pay | Admitting: Obstetrics

## 2017-06-19 ENCOUNTER — Ambulatory Visit (INDEPENDENT_AMBULATORY_CARE_PROVIDER_SITE_OTHER): Payer: Medicaid Other | Admitting: Obstetrics

## 2017-06-19 DIAGNOSIS — Z3480 Encounter for supervision of other normal pregnancy, unspecified trimester: Secondary | ICD-10-CM

## 2017-06-19 DIAGNOSIS — Z3483 Encounter for supervision of other normal pregnancy, third trimester: Secondary | ICD-10-CM

## 2017-06-19 NOTE — Progress Notes (Signed)
Subjective:  Shannon Frye is Frye 27 y.o. G2P0010 at 6350w0d being seen today for ongoing prenatal care.  She is currently monitored for the following issues for this low-risk pregnancy and has Supervision of other normal pregnancy, antepartum; Single umbilical artery; ?bi-lobed placenta; and HSV-2 infection complicating pregnancy on their problem list.  Patient reports no complaints.  Contractions: Irregular. Vag. Bleeding: None.  Movement: Present. Denies leaking of fluid.   The following portions of the patient's history were reviewed and updated as appropriate: allergies, current medications, past family history, past medical history, past social history, past surgical history and problem list. Problem list updated.  Objective:   Vitals:   06/19/17 1434  BP: 129/85  Pulse: 71  Weight: 152 lb (68.9 kg)    Fetal Status: Fetal Heart Rate (bpm): 150   Movement: Present     General:  Alert, oriented and cooperative. Patient is in no acute distress.  Skin: Skin is warm and dry. No rash noted.   Cardiovascular: Normal heart rate noted  Respiratory: Normal respiratory effort, no problems with respiration noted  Abdomen: Soft, gravid, appropriate for gestational age. Pain/Pressure: Present     Pelvic:  Cervical exam deferred        Extremities: Normal range of motion.  Edema: None  Mental Status: Normal mood and affect. Normal behavior. Normal judgment and thought content.   Urinalysis:      Assessment and Plan:  Pregnancy: G2P0010 at 7450w0d  1. Supervision of other normal pregnancy, antepartum   Term labor symptoms and general obstetric precautions including but not limited to vaginal bleeding, contractions, leaking of fluid and fetal movement were reviewed in detail with the patient. Please refer to After Visit Summary for other counseling recommendations.  Return in about 1 week (around 06/26/2017) for ROB.   Shannon Frye, Shannon Doring A, MD

## 2017-06-20 ENCOUNTER — Telehealth (HOSPITAL_COMMUNITY): Payer: Self-pay | Admitting: *Deleted

## 2017-06-20 NOTE — Telephone Encounter (Signed)
Preadmission screen  

## 2017-06-25 ENCOUNTER — Encounter (HOSPITAL_COMMUNITY): Payer: Self-pay | Admitting: *Deleted

## 2017-06-25 ENCOUNTER — Telehealth (HOSPITAL_COMMUNITY): Payer: Self-pay | Admitting: *Deleted

## 2017-06-25 NOTE — Telephone Encounter (Signed)
Preadmission screen  

## 2017-06-26 ENCOUNTER — Encounter (HOSPITAL_COMMUNITY): Payer: Self-pay | Admitting: *Deleted

## 2017-06-26 ENCOUNTER — Ambulatory Visit (INDEPENDENT_AMBULATORY_CARE_PROVIDER_SITE_OTHER): Payer: Medicaid Other | Admitting: Obstetrics

## 2017-06-26 ENCOUNTER — Inpatient Hospital Stay (HOSPITAL_COMMUNITY)
Admission: AD | Admit: 2017-06-26 | Discharge: 2017-06-29 | DRG: 806 | Disposition: A | Discharge status: Home / Self Care | Payer: Medicaid Other | Source: Ambulatory Visit | Attending: Obstetrics and Gynecology | Admitting: Obstetrics and Gynecology

## 2017-06-26 ENCOUNTER — Encounter: Payer: Self-pay | Admitting: Obstetrics

## 2017-06-26 VITALS — BP 127/74 | HR 70 | Wt 152.4 lb

## 2017-06-26 DIAGNOSIS — O368390 Maternal care for abnormalities of the fetal heart rate or rhythm, unspecified trimester, not applicable or unspecified: Secondary | ICD-10-CM

## 2017-06-26 DIAGNOSIS — Z3490 Encounter for supervision of normal pregnancy, unspecified, unspecified trimester: Secondary | ICD-10-CM

## 2017-06-26 DIAGNOSIS — O99824 Streptococcus B carrier state complicating childbirth: Secondary | ICD-10-CM | POA: Diagnosis present

## 2017-06-26 DIAGNOSIS — B009 Herpesviral infection, unspecified: Secondary | ICD-10-CM | POA: Diagnosis present

## 2017-06-26 DIAGNOSIS — O09893 Supervision of other high risk pregnancies, third trimester: Secondary | ICD-10-CM

## 2017-06-26 DIAGNOSIS — A6000 Herpesviral infection of urogenital system, unspecified: Secondary | ICD-10-CM | POA: Diagnosis present

## 2017-06-26 DIAGNOSIS — O98519 Other viral diseases complicating pregnancy, unspecified trimester: Secondary | ICD-10-CM

## 2017-06-26 DIAGNOSIS — Z3A40 40 weeks gestation of pregnancy: Secondary | ICD-10-CM | POA: Diagnosis not present

## 2017-06-26 DIAGNOSIS — Z87891 Personal history of nicotine dependence: Secondary | ICD-10-CM | POA: Diagnosis not present

## 2017-06-26 DIAGNOSIS — N898 Other specified noninflammatory disorders of vagina: Secondary | ICD-10-CM

## 2017-06-26 DIAGNOSIS — Q270 Congenital absence and hypoplasia of umbilical artery: Secondary | ICD-10-CM

## 2017-06-26 DIAGNOSIS — O9832 Other infections with a predominantly sexual mode of transmission complicating childbirth: Secondary | ICD-10-CM | POA: Diagnosis present

## 2017-06-26 DIAGNOSIS — O09899 Supervision of other high risk pregnancies, unspecified trimester: Secondary | ICD-10-CM

## 2017-06-26 DIAGNOSIS — O43109 Malformation of placenta, unspecified, unspecified trimester: Secondary | ICD-10-CM | POA: Diagnosis present

## 2017-06-26 DIAGNOSIS — Z3483 Encounter for supervision of other normal pregnancy, third trimester: Secondary | ICD-10-CM

## 2017-06-26 LAB — CBC
HCT: 37 % (ref 36.0–46.0)
HEMOGLOBIN: 12.9 g/dL (ref 12.0–15.0)
MCH: 33.6 pg (ref 26.0–34.0)
MCHC: 34.9 g/dL (ref 30.0–36.0)
MCV: 96.4 fL (ref 78.0–100.0)
PLATELETS: 254 10*3/uL (ref 150–400)
RBC: 3.84 MIL/uL — ABNORMAL LOW (ref 3.87–5.11)
RDW: 13.1 % (ref 11.5–15.5)
WBC: 8.5 10*3/uL (ref 4.0–10.5)

## 2017-06-26 LAB — TYPE AND SCREEN
ABO/RH(D): O NEG
ANTIBODY SCREEN: NEGATIVE

## 2017-06-26 LAB — ABO/RH: ABO/RH(D): O NEG

## 2017-06-26 MED ORDER — MISOPROSTOL 25 MCG QUARTER TABLET
25.0000 ug | ORAL_TABLET | ORAL | Status: DC | PRN
  Administered 2017-06-26: 25 ug via VAGINAL
  Filled 2017-06-26 (×2): qty 1

## 2017-06-26 MED ORDER — SOD CITRATE-CITRIC ACID 500-334 MG/5ML PO SOLN: 30.0000 mL | ORAL | Status: DC | PRN

## 2017-06-26 MED ORDER — LACTATED RINGERS IV SOLN: 500.0000 mL | INTRAVENOUS | Status: DC | PRN

## 2017-06-26 MED ORDER — OXYTOCIN 40 UNITS IN LACTATED RINGERS INFUSION - SIMPLE MED: 2.5000 [IU]/h | INTRAVENOUS | Status: DC

## 2017-06-26 MED ORDER — PENICILLIN G POT IN DEXTROSE 60000 UNIT/ML IV SOLN
3.0000 10*6.[IU] | INTRAVENOUS | Status: DC
  Administered 2017-06-26 – 2017-06-27 (×2): 3 10*6.[IU] via INTRAVENOUS
  Filled 2017-06-26 (×5): qty 50

## 2017-06-26 MED ORDER — LACTATED RINGERS IV SOLN
INTRAVENOUS | Status: DC
  Administered 2017-06-26 – 2017-06-27 (×2): via INTRAVENOUS

## 2017-06-26 MED ORDER — ACETAMINOPHEN 325 MG PO TABS: 650.0000 mg | ORAL_TABLET | ORAL | Status: DC | PRN

## 2017-06-26 MED ORDER — ONDANSETRON HCL 4 MG/2ML IJ SOLN
4.0000 mg | Freq: Four times a day (QID) | INTRAMUSCULAR | Status: DC | PRN
  Filled 2017-06-26: qty 2

## 2017-06-26 MED ORDER — SODIUM CHLORIDE 0.9 % IV SOLN
5.0000 10*6.[IU] | Freq: Once | INTRAVENOUS | Status: AC
  Administered 2017-06-26: 5 10*6.[IU] via INTRAVENOUS
  Filled 2017-06-26: qty 5

## 2017-06-26 MED ORDER — FLUCONAZOLE 150 MG PO TABS: 150.0000 mg | ORAL_TABLET | Freq: Once | ORAL | 0 refills | Status: DC

## 2017-06-26 MED ORDER — OXYCODONE-ACETAMINOPHEN 5-325 MG PO TABS: 2.0000 | ORAL_TABLET | ORAL | Status: DC | PRN

## 2017-06-26 MED ORDER — LIDOCAINE HCL (PF) 1 % IJ SOLN
30.0000 mL | INTRAMUSCULAR | Status: DC | PRN
  Filled 2017-06-26: qty 30

## 2017-06-26 MED ORDER — OXYTOCIN BOLUS FROM INFUSION
500.0000 mL | Freq: Once | INTRAVENOUS | Status: AC
  Administered 2017-06-27: 500 mL via INTRAVENOUS

## 2017-06-26 MED ORDER — FENTANYL CITRATE (PF) 100 MCG/2ML IJ SOLN
100.0000 ug | INTRAMUSCULAR | Status: DC | PRN
  Administered 2017-06-26: 100 ug via INTRAVENOUS
  Filled 2017-06-26: qty 2

## 2017-06-26 MED ORDER — OXYCODONE-ACETAMINOPHEN 5-325 MG PO TABS: 1.0000 | ORAL_TABLET | ORAL | Status: DC | PRN

## 2017-06-26 NOTE — Anesthesia Pain Management Evaluation Note (Signed)
  CRNA Pain Management Visit Note  Patient: Shannon Frye, 27 y.o., female  "Hello I am a member of the anesthesia team at [facility]. We have an anesthesia team available at all times to provide care throughout the hospital, including epidural management and anesthesia for C-section. I don't know your plan for the delivery whether it a natural birth, water birth, IV sedation, nitrous supplementation, doula or epidural, but we want to meet your pain goals."   1.Was your pain managed to your expectations on prior hospitalizations?   No prior hospitalizations  2.What is your expectation for pain management during this hospitalization?     Epidural and IV pain meds  3.How can we help you reach that goal? Be available  Record the patient's initial score and the patient's pain goal.   Pain: 0  Pain Goal: 7 [facility] wants you to be able to say your pain was always managed very well.  Edison PaceWILKERSON,Ireoluwa Grant 06/26/2017

## 2017-06-26 NOTE — Progress Notes (Signed)
Labor Progress Note Shannon Frye is a 27 y.o. G2P0010 at 4117w0d presented for IOL for NRFHR on NST.  S: Patient feeling tired at the moment. No complaints or questions at this time.  O:  BP 124/72   Pulse 65   Temp 98.2 F (36.8 C) (Oral)   Resp 18   Ht 5\' 3"  (1.6 m)   Wt 152 lb 11.2 oz (69.3 kg)   LMP 09/19/2016   BMI 27.05 kg/m   FHT: baseline rate 140, moderate variability, + acels, no decels Toco: variable ctx, irregular   CVE: Dilation: 1 Cervical Position: Posterior Station: -2 Presentation: Vertex Exam by:: Degele  A&P: 27 y.o. G2P0010 4817w0d here for IOL for NRFHR on NST. #Labor: Progressing well. FB placed at 1640 and remains in place. Will start Cytotec.  #Pain: IV Fentanyl, epidural on patient request #FWB: Cat I #GBS positive on PCN  Single umbilical artery, H/o HSV (no active lesions).  [name], DO 9:45 PM

## 2017-06-26 NOTE — Progress Notes (Signed)
Labor Progress Note Shannon Frye is a 27 y.o. G2P0010 at 8036w0d presented for IOL for NRFHR on NST.  S: Patient sleeping at this time. Mother of patient expressed no concerns or questions.  O:  BP (!) 142/73   Pulse (!) 49   Temp 97.8 F (36.6 C) (Axillary)   Resp 16   Ht 5\' 3"  (1.6 m)   Wt 152 lb 11.2 oz (69.3 kg)   LMP 09/19/2016   BMI 27.05 kg/m   WUJ:WJXBJYNWFHT:baseline rate 135, moderate variability, no acels, +early decels Toco: ctx every 5-6 min  CVE: Dilation: 1 Cervical Position: Posterior Station: -2 Presentation: Vertex Exam by:: Degele  A&P: 27 y.o. G2P0010 6536w0d here for IOL for NRFHR on NST. #Labor: Progressing slowly. FB placed at 1640 and continues to remain in place. Continue Cytotec. #Pain: IV Fentanyl. Epidural on patient request. #FWB: Cat I #GBS positive on PCN Single umbilical artery, H/o HSV (no active lesions)  [name], DO 11:37 PM

## 2017-06-26 NOTE — Progress Notes (Signed)
Subjective:  Shannon Frye is a 27 y.o. G2P0010 at 7468w0d being seen today for ongoing prenatal care.  She is currently monitored for the following issues for this low-risk pregnancy and has Supervision of other normal pregnancy, antepartum; Single umbilical artery; ?bi-lobed placenta; and HSV-2 infection complicating pregnancy on their problem list.  Patient reports no complaints.  Contractions: Irritability. Vag. Bleeding: None.  Movement: Present. Denies leaking of fluid.   The following portions of the patient's history were reviewed and updated as appropriate: allergies, current medications, past family history, past medical history, past social history, past surgical history and problem list. Problem list updated.  Objective:   Vitals:   06/26/17 1423  BP: 127/74  Pulse: 70  Weight: 152 lb 6.4 oz (69.1 kg)    Fetal Status:     Movement: Present     General:  Alert, oriented and cooperative. Patient is in no acute distress.  Skin: Skin is warm and dry. No rash noted.   Cardiovascular: Normal heart rate noted  Respiratory: Normal respiratory effort, no problems with respiration noted  Abdomen: Soft, gravid, appropriate for gestational age. Pain/Pressure: Present     Pelvic:  Cervical exam performed      Cvx:  1-2 cm / 70% / 0 / Vtx  Extremities: Normal range of motion.  Edema: None  Mental Status: Normal mood and affect. Normal behavior. Normal judgment and thought content.   Urinalysis:      NST:  Nonreactive.  Good variability with 10x10 accels, but had a 3-4 minute decel down to 100 bpm  Assessment and Plan:  Pregnancy: G2P0010 at 6568w0d  1. Encounter for supervision of normal pregnancy, antepartum, unspecified gravidity  2. Single Umbilical Artery - has had normal growth and AFI  3. Vaginal discharge Rx: - fluconazole (DIFLUCAN) 150 MG tablet; Take 1 tablet (150 mg total) by mouth once for 1 dose.  Dispense: 1 tablet; Refill: 0  4. Vagina itching  5. FHR decels  on non reactive NST - [facility], hopefully by IOL if fetus tolerates labor   Term labor symptoms and general obstetric precautions including but not limited to vaginal bleeding, contractions, leaking of fluid and fetal movement were reviewed in detail with the patient. Please refer to After Visit Summary for other counseling recommendations.  Return in about 4 weeks (around 07/24/2017) for postpartum.   Brock BadHarper, Makahla Kiser A, MD

## 2017-06-26 NOTE — Progress Notes (Signed)
Cottage cheese discharge, itching; no odor

## 2017-06-26 NOTE — H&P (Addendum)
Shannon Frye is a 27 y.o. female presenting for IOL 2/2 NRFHR in office at term. Hx of dx of HSV during this pregnancy. Additionally, single umbilical artery and possible bilobed placenta.   OB History    Gravida Para Term Preterm AB Living   2       1 0   SAB TAB Ectopic Multiple Live Births     1           Past Medical History:  Diagnosis Date  . HSV (herpes simplex virus) anogenital infection   . Medical history non-contributory    Past Surgical History:  Procedure Laterality Date  . ANTERIOR CRUCIATE LIGAMENT REPAIR    . INDUCED ABORTION     Family History: family history includes Diabetes in her mother. Social History:  reports that she quit smoking about 2 months ago. She smoked 0.50 packs per day. she has never used smokeless tobacco. She reports that she uses drugs. Drug: Marijuana. She reports that she does not drink alcohol.     Maternal Diabetes: No Genetic Screening: Declined Maternal Ultrasounds/Referrals: Abnormal:  Findings:   Other: single umbilical artery  Fetal Ultrasounds or other Referrals:  Other:  growth normal on last US, ?bilobed placenta Maternal Substance Abuse:  No Significant Maternal Medications:  None Significant Maternal Lab Results:  Lab values include: Other: HSV2  Other Comments:  None  ROS History   Blood pressure 126/69, pulse 63, temperature 98.3 F (36.8 C), temperature source Oral, resp. rate 16, height 5\' 3"  (1.6 m), weight 152 lb 11.2 oz (69.3 kg), last menstrual period 09/19/2016. Exam Physical Exam  Prenatal labs: ABO, Rh: O/Negative/-- (09/21 1132) Antibody: Negative (09/21 1132) Rubella: 4.42 (09/21 1132) RPR: Non Reactive (11/29 1050)  HBsAg: Negative (09/21 1132)  HIV: Non Reactive (11/29 1050)  GBS: Positive (01/31 1722)   Assessment/Plan: Admit to [facility] IOL for NRFHR in the office  FB placed on admission by Dr. Nira Retortegele, will hold off on cytotec per mom's wishes for "natural induction."  Pain: wants to try no  pain meds for now  Baby feeding: breast/bottle Contraception: OCPs PreE: no sxs ID: GBS positive > PCN   Shannon Frye 06/26/2017, 4:12 PM  OB FELLOW HISTORY AND PHYSICAL ATTESTATION I have seen and examined this patient; I agree with above documentation in the resident's note.   Pt had 3-min decel in office. FHT currently Cat I and reactive. FB placed for cervical ripening. If FHT remains reassuring, will consider doing cytotec; otherwise would do Pitocin.   Frederik PearJulie P Degele, MD OB Fellow 06/26/2017, 5:01 PM

## 2017-06-27 ENCOUNTER — Inpatient Hospital Stay (HOSPITAL_COMMUNITY): Payer: Medicaid Other | Admitting: Anesthesiology

## 2017-06-27 ENCOUNTER — Other Ambulatory Visit: Payer: Self-pay

## 2017-06-27 ENCOUNTER — Encounter (HOSPITAL_COMMUNITY): Payer: Self-pay

## 2017-06-27 DIAGNOSIS — Z3A40 40 weeks gestation of pregnancy: Secondary | ICD-10-CM

## 2017-06-27 DIAGNOSIS — O99824 Streptococcus B carrier state complicating childbirth: Secondary | ICD-10-CM

## 2017-06-27 LAB — RPR: RPR: NONREACTIVE

## 2017-06-27 MED ORDER — SENNOSIDES-DOCUSATE SODIUM 8.6-50 MG PO TABS
2.0000 | ORAL_TABLET | ORAL | Status: DC
  Administered 2017-06-27 – 2017-06-29 (×2): 2 via ORAL
  Filled 2017-06-27 (×2): qty 2

## 2017-06-27 MED ORDER — ACETAMINOPHEN 325 MG PO TABS: 650.0000 mg | ORAL_TABLET | ORAL | Status: DC | PRN

## 2017-06-27 MED ORDER — LIDOCAINE HCL (PF) 1 % IJ SOLN
INTRAMUSCULAR | Status: DC | PRN
  Administered 2017-06-27 (×2): 5 mL via EPIDURAL

## 2017-06-27 MED ORDER — WITCH HAZEL-GLYCERIN EX PADS: 1.0000 "application " | MEDICATED_PAD | CUTANEOUS | Status: DC | PRN

## 2017-06-27 MED ORDER — PHENYLEPHRINE 40 MCG/ML (10ML) SYRINGE FOR IV PUSH (FOR BLOOD PRESSURE SUPPORT)
80.0000 ug | PREFILLED_SYRINGE | INTRAVENOUS | Status: DC | PRN
  Filled 2017-06-27: qty 5

## 2017-06-27 MED ORDER — FENTANYL 2.5 MCG/ML BUPIVACAINE 1/10 % EPIDURAL INFUSION (WH - ANES)
14.0000 mL/h | INTRAMUSCULAR | Status: DC | PRN
  Administered 2017-06-27: 14 mL/h via EPIDURAL
  Filled 2017-06-27: qty 100

## 2017-06-27 MED ORDER — TETANUS-DIPHTH-ACELL PERTUSSIS 5-2.5-18.5 LF-MCG/0.5 IM SUSP: 0.5000 mL | Freq: Once | INTRAMUSCULAR | Status: DC

## 2017-06-27 MED ORDER — SIMETHICONE 80 MG PO CHEW: 80.0000 mg | CHEWABLE_TABLET | ORAL | Status: DC | PRN

## 2017-06-27 MED ORDER — ZOLPIDEM TARTRATE 5 MG PO TABS: 5.0000 mg | ORAL_TABLET | Freq: Every evening | ORAL | Status: DC | PRN

## 2017-06-27 MED ORDER — BENZOCAINE-MENTHOL 20-0.5 % EX AERO
1.0000 "application " | INHALATION_SPRAY | CUTANEOUS | Status: DC | PRN
  Administered 2017-06-27: 1 via TOPICAL
  Filled 2017-06-27: qty 56

## 2017-06-27 MED ORDER — DIPHENHYDRAMINE HCL 50 MG/ML IJ SOLN: 12.5000 mg | INTRAMUSCULAR | Status: DC | PRN

## 2017-06-27 MED ORDER — COCONUT OIL OIL
1.0000 "application " | TOPICAL_OIL | Status: DC | PRN
  Administered 2017-06-28: 1 via TOPICAL
  Filled 2017-06-27: qty 120

## 2017-06-27 MED ORDER — PHENYLEPHRINE 40 MCG/ML (10ML) SYRINGE FOR IV PUSH (FOR BLOOD PRESSURE SUPPORT)
80.0000 ug | PREFILLED_SYRINGE | INTRAVENOUS | Status: DC | PRN
  Filled 2017-06-27: qty 10
  Filled 2017-06-27: qty 5

## 2017-06-27 MED ORDER — PRENATAL MULTIVITAMIN CH
1.0000 | ORAL_TABLET | Freq: Every day | ORAL | Status: DC
  Administered 2017-06-27 – 2017-06-29 (×3): 1 via ORAL
  Filled 2017-06-27 (×3): qty 1

## 2017-06-27 MED ORDER — DIPHENHYDRAMINE HCL 25 MG PO CAPS: 25.0000 mg | ORAL_CAPSULE | Freq: Four times a day (QID) | ORAL | Status: DC | PRN

## 2017-06-27 MED ORDER — ONDANSETRON HCL 4 MG/2ML IJ SOLN: 4.0000 mg | INTRAMUSCULAR | Status: DC | PRN

## 2017-06-27 MED ORDER — TERBUTALINE SULFATE 1 MG/ML IJ SOLN
0.2500 mg | Freq: Once | INTRAMUSCULAR | Status: DC | PRN
  Filled 2017-06-27: qty 1

## 2017-06-27 MED ORDER — EPHEDRINE 5 MG/ML INJ
10.0000 mg | INTRAVENOUS | Status: DC | PRN
  Filled 2017-06-27: qty 2

## 2017-06-27 MED ORDER — OXYTOCIN 40 UNITS IN LACTATED RINGERS INFUSION - SIMPLE MED
1.0000 m[IU]/min | INTRAVENOUS | Status: DC
  Administered 2017-06-27: 2 m[IU]/min via INTRAVENOUS
  Filled 2017-06-27: qty 1000

## 2017-06-27 MED ORDER — IBUPROFEN 600 MG PO TABS
600.0000 mg | ORAL_TABLET | Freq: Four times a day (QID) | ORAL | Status: DC
  Administered 2017-06-27 – 2017-06-29 (×9): 600 mg via ORAL
  Filled 2017-06-27 (×9): qty 1

## 2017-06-27 MED ORDER — LACTATED RINGERS IV SOLN: 500.0000 mL | Freq: Once | INTRAVENOUS | Status: DC

## 2017-06-27 MED ORDER — DIBUCAINE 1 % RE OINT: 1.0000 "application " | TOPICAL_OINTMENT | RECTAL | Status: DC | PRN

## 2017-06-27 MED ORDER — ONDANSETRON HCL 4 MG PO TABS: 4.0000 mg | ORAL_TABLET | ORAL | Status: DC | PRN

## 2017-06-27 NOTE — Anesthesia Preprocedure Evaluation (Signed)

## 2017-06-27 NOTE — Lactation Note (Signed)
This note was copied from a baby's chart. Lactation Consultation Note  Patient Name: [name]'XToday's Date: 06/27/2017 Reason for consult: Initial assessment;Term Breastfeeding consultation services and support information given to patient.  Newborn is 8 hours old and has been to breast twice.  Assisted with football hold on right breast.  Baby latched easily and well.  Observed baby feed for 10 minutes and baby remained active when I left.  Instructed to feed with cues and call out for assist prn.  Maternal Data    Feeding Feeding Type: Breast Fed Length of feed: 15 min  LATCH Score Latch: Grasps breast easily, tongue down, lips flanged, rhythmical sucking.  Audible Swallowing: A few with stimulation  Type of Nipple: Everted at rest and after stimulation  Comfort (Breast/Nipple): Soft / non-tender  Hold (Positioning): Assistance needed to correctly position infant at breast and maintain latch.  LATCH Score: 8  Interventions Interventions: Breast feeding basics reviewed;Assisted with latch;Breast compression;Skin to skin;Adjust position;Breast massage;Support pillows  Lactation Tools Discussed/Used     Consult Status Consult Status: Follow-up Date: 06/28/17 Follow-up type: In-patient    Huston FoleyMOULDEN, Laetitia Schnepf S 06/27/2017, 1:44 PM

## 2017-06-27 NOTE — Progress Notes (Signed)
Labor Progress Note Shannon Frye is a 27 y.o. G2P0010 at 348w1d presented for IOL for nonreassuring NST S: No complaints  O:  BP 131/66   Pulse (!) 50   Temp 97.8 F (36.6 C) (Axillary)   Resp 18   Ht 5\' 3"  (1.6 m)   Wt 69.3 kg (152 lb 11.2 oz)   LMP 09/19/2016   BMI 27.05 kg/m  EFM: 130 bpm/mod var/no decels  CVE: Dilation: 5 Effacement (%): 80 Cervical Position: Posterior Station: -2 Presentation: Vertex Exam by:: Rachelle Hora(Andreal Vultaggio, MD)   A&P: 27 y.o. G2P0010 748w1d here for IOL for nonreassuring NST. #Labor: Progressing well. FB out. Will start pitocin. When patient is more comfortable, will AROM. #Pain: epidural now   [name], DO 1:19 AM

## 2017-06-27 NOTE — Anesthesia Postprocedure Evaluation (Signed)
Anesthesia Post Note  Patient: Luciana AxeBrianna D Magno  Procedure(s) Performed: AN AD HOC LABOR EPIDURAL     Patient location during evaluation: Mother Baby Anesthesia Type: Epidural Level of consciousness: awake and alert and oriented Pain management: satisfactory to patient Vital Signs Assessment: post-procedure vital signs reviewed and stable Respiratory status: respiratory function stable Cardiovascular status: stable Postop Assessment: no headache, no backache, epidural receding, patient able to bend at knees, no signs of nausea or vomiting and adequate PO intake Anesthetic complications: no    Last Vitals:  Vitals:   06/27/17 0900 06/27/17 1300  BP: 123/68 (!) 121/52  Pulse: 74 70  Resp: 17 18  Temp: 36.9 C 37 C  SpO2:      Last Pain:  Vitals:   06/27/17 1300  TempSrc: Oral  PainSc: 0-No pain   Pain Goal:                 Jatin Naumann

## 2017-06-27 NOTE — Progress Notes (Signed)
Labor Progress Note Shannon Frye is a 27 y.o. G2P0010 at 3765w1d presented for IOL for nonreassuring NST  S: Patient resting comfortably. No concerns or questions.  O:  BP (!) 110/57   Pulse 65   Temp 98.7 F (37.1 C) (Axillary)   Resp 16   Ht 5\' 3"  (1.6 m)   Wt 152 lb 11.2 oz (69.3 kg)   LMP 09/19/2016   SpO2 100%   BMI 27.05 kg/m   UVO:ZDGUYQIHFHT:baseline rate 140, moderate variability, no acels, early decels CVE: Dilation: 7 Effacement (%): 80 Cervical Position: Posterior Station: -2 Presentation: Vertex Exam by:: Rachelle Hora(Moss, MD)  A&P: 27 y.o. G2P0010 2465w1d here for IOL for nonreassring NST #Labor: Progressing well. AROM @ 0243. Continue pitocin. #Pain: Epidural #FWB: Cat I #GBS positive on PCN  [name], DO 3:30 AM

## 2017-06-27 NOTE — Anesthesia Procedure Notes (Signed)
Epidural Patient location during procedure: OB Start time: 06/27/2017 1:55 AM End time: 06/27/2017 2:00 AM  Staffing Anesthesiologist: Bethena Midgetddono, Martavius Lusty, MD  Preanesthetic Checklist Completed: patient identified, site marked, surgical consent, pre-op evaluation, timeout performed, IV checked, risks and benefits discussed and monitors and equipment checked  Epidural Patient position: sitting Prep: site prepped and draped and DuraPrep Patient monitoring: continuous pulse ox and blood pressure Approach: midline Location: L3-L4 Injection technique: LOR air  Needle:  Needle type: Tuohy  Needle gauge: 17 G Needle length: 9 cm and 9 Needle insertion depth: 6 cm Catheter type: closed end flexible Catheter size: 19 Gauge Catheter at skin depth: 11 cm Test dose: negative  Assessment Events: blood not aspirated, injection not painful, no injection resistance, negative IV test and no paresthesia

## 2017-06-28 ENCOUNTER — Encounter (HOSPITAL_COMMUNITY): Payer: Self-pay | Admitting: *Deleted

## 2017-06-28 MED ORDER — RHO D IMMUNE GLOBULIN 1500 UNIT/2ML IJ SOSY
300.0000 ug | PREFILLED_SYRINGE | Freq: Once | INTRAMUSCULAR | Status: AC
  Administered 2017-06-28: 300 ug via INTRAVENOUS
  Filled 2017-06-28: qty 2

## 2017-06-28 NOTE — Progress Notes (Signed)
Post Partum Day 1  Subjective: no complaints, up ad lib, voiding and tolerating PO  Objective: Blood pressure 114/65, pulse 65, temperature 97.9 F (36.6 C), temperature source Oral, resp. rate 16, height 5\' 3"  (1.6 m), weight 66.8 kg (147 lb 3 oz), last menstrual period 09/19/2016, SpO2 100 %, unknown if currently breastfeeding.  Physical Exam:  General: alert, cooperative and no distress Lochia: appropriate Uterine Fundus: firm DVT Evaluation: No evidence of DVT seen on physical exam. No cords or calf tenderness. No significant calf/ankle edema.  Recent Labs    06/26/17 1610  HGB 12.9  HCT 37.0    Assessment/Plan: Plan for discharge tomorrow  Breastfeeding Continue routine postpartum care   LOS: 2 days   Caryl AdaJazma Charlette Hennings, DO 06/28/2017, 7:23 AM

## 2017-06-29 LAB — RH IG WORKUP (INCLUDES ABO/RH)
ABO/RH(D): O NEG
FETAL SCREEN: NEGATIVE
GESTATIONAL AGE(WKS): 40.1
Unit division: 0

## 2017-06-29 MED ORDER — IBUPROFEN 600 MG PO TABS: 600.0000 mg | ORAL_TABLET | Freq: Four times a day (QID) | ORAL | 0 refills | Status: AC

## 2017-06-29 NOTE — Lactation Note (Signed)
This note was copied from a baby's chart. Lactation Consultation Note  Patient Name: Shannon Frye HYQMV'HToday's Date: 06/29/2017 Reason for consult: Follow-up assessment;Hyperbilirubinemia Mom pumped 30+ mls from each breast.  She will supplement with a slow flow nipple due to weight loss.  Plan is to breastfeed with cues, post pump every 2-3 hours and give expressed milk back to baby.  Maternal Data Has patient been taught Hand Expression?: Yes Does the patient have breastfeeding experience prior to this delivery?: No  Feeding Feeding Type: Breast Milk Nipple Type: Slow - flow Length of feed: (S) 10 min(still BF)  LATCH Score Latch: Grasps breast easily, tongue down, lips flanged, rhythmical sucking.  Audible Swallowing: Spontaneous and intermittent  Type of Nipple: Flat  Comfort (Breast/Nipple): Filling, red/small blisters or bruises, mild/mod discomfort  Hold (Positioning): No assistance needed to correctly position infant at breast.  LATCH Score: 8  Interventions Interventions: Breast feeding basics reviewed;Breast massage;Support pillows  Lactation Tools Discussed/Used WIC Program: Yes Pump Review: Setup, frequency, and cleaning;Milk Storage Initiated by:: Glory RosebushBriana Williams, RN [phone] Date initiated:: 06/29/17   Consult Status Consult Status: Follow-up Date: 06/30/17 Follow-up type: In-patient    Huston FoleyMOULDEN, Yordin Rhoda S 06/29/2017, 11:12 AM

## 2017-06-29 NOTE — Progress Notes (Signed)
Discharge teaching complete with pt. Pt understood all information and did not have any questions. 

## 2017-06-29 NOTE — Discharge Summary (Signed)
OB Discharge Summary     Patient Name: Shannon Frye DOB: July 14, 1990 MRN: [id number]  Date of admission: 06/26/2017 Delivering MD: Rolm Bookbinder   Date of discharge: 06/29/2017  Admitting diagnosis: 40WKS INDUCTION Intrauterine pregnancy: [redacted]w[redacted]d     Secondary diagnosis:  Active Problems:   Single umbilical artery   ?bi-lobed placenta   HSV-2 infection complicating pregnancy   Non-reassuring fetal heart rate or rhythm affecting management of mother   SVD (spontaneous vaginal delivery)  Additional problems: none     Discharge diagnosis: Term Pregnancy Delivered                                                                                                Post partum procedures:none  Augmentation: Pitocin and Cytotec, Foley  Complications: None  Hospital course:  Induction of Labor With Vaginal Delivery   27 y.o. yo G2P1011 at [redacted]w[redacted]d was admitted to the hospital 06/26/2017 for induction of labor.  Indication for induction: non-reassuring FHT. S/p IOL with FB, cytotec and Pitocin. Labor course complicated by fetal heart deceleration, with attempt of vacuum-assisted delivery, but patient deliver spontaneously (see delivery note for details).   Membrane Rupture Time/Date: 2:43 AM ,06/27/2017   Intrapartum Procedures: Episiotomy: None [1]                                         Lacerations:  1st degree [2];Perineal [11]  Patient had delivery of a Viable infant.  Information for the patient's newborn:  Shannon Frye [[id number]]  Delivery Method: Vag-Spont   06/27/2017  Details of delivery can be found in separate delivery note.  Patient had a routine postpartum course. Patient is discharged home 06/29/17.  Physical exam  Vitals:   06/28/17 0555 06/28/17 0600 06/28/17 1748 06/29/17 0500  BP: 114/65  130/71 117/63  Pulse: 65  63 67  Resp: 16  18 18   Temp: 97.9 F (36.6 C)  98.3 F (36.8 C) 97.9 F (36.6 C)  TempSrc: Oral  Oral Oral  SpO2: 100%   99%  Weight:  147  lb 3 oz (66.8 kg)  145 lb 6.4 oz (66 kg)  Height:       General: alert, cooperative and no distress Lochia: appropriate Uterine Fundus: firm Incision: N/A DVT Evaluation: No evidence of DVT seen on physical exam. No significant calf/ankle edema. Labs: Lab Results  Component Value Date   WBC 8.5 06/26/2017   HGB 12.9 06/26/2017   HCT 37.0 06/26/2017   MCV 96.4 06/26/2017   PLT 254 06/26/2017   CMP Latest Ref Rng & Units 02/28/2016  Glucose 65 - 99 mg/dL 086(V)  BUN 6 - 20 mg/dL 10  Creatinine 7.84 - 6.96 mg/dL 2.95  Sodium 284 - 132 mmol/L 137  Potassium 3.5 - 5.1 mmol/L 3.5  Chloride 101 - 111 mmol/L 100(L)  CO2 22 - 32 mmol/L 28  Calcium 8.9 - 10.3 mg/dL 9.3  Total Protein 6.5 - 8.1 g/dL 7.8  Total Bilirubin 0.3 - 1.2 mg/dL 0.4  Alkaline Phos 38 - 126 U/L 69  AST 15 - 41 U/L 18  ALT 14 - 54 U/L 14    Discharge instruction: per After Visit Summary and "Baby and Me Booklet".  After visit meds:  Allergies as of 06/29/2017   No Known Allergies     Medication List    TAKE these medications   calcium carbonate 500 MG chewable tablet Commonly known as:  TUMS - dosed in mg elemental calcium Chew 1 tablet by mouth daily.   ibuprofen 600 MG tablet Commonly known as:  ADVIL,MOTRIN Take 1 tablet (600 mg total) by mouth every 6 (six) hours.   PRENATE MINI 29-0.6-0.4-350 MG Caps Take 1 capsule by mouth daily before breakfast.       Diet: routine diet  Activity: Advance as tolerated. Pelvic rest for 6 weeks.   Outpatient follow up: 4 weeks (messaged sent to admin pool for scheduling)  Postpartum contraception: Progesterone only pills  Newborn Data: Live born female  Birth Weight: 6 lb 6.7 oz (2910 g) APGAR: 8, 9  Newborn Delivery   Birth date/time:  06/27/2017 05:22:00 Delivery type:  Vaginal, Spontaneous     Baby Feeding: Bottle and Breast Disposition:home with mother   06/29/2017 Shannon PearJulie P Degele, MD

## 2017-06-29 NOTE — Clinical Social Work Note (Signed)
CSW received consult for MOB admitting to hx of marijuana use. Referral was screened out due to the following:  ~MOB had no documented substance use  during prenatal visits.  ~MOB had no positive drug screens ~Baby's UDS is negative.  Please consult CSW if current concerns arise or by MOB's request.  CSW will monitor CDS results and make report to Child Protective Services if warranted.

## 2017-06-29 NOTE — Lactation Note (Signed)
This note was copied from a baby's chart. Lactation Consultation Note Baby 50 hrs old. Had 10 % weight loss. Out put 5 voids and 2 stools. Baby on DPT. Mom BF baby in football position, baby latched well, good compressions noted.  Moms breast are filling. Nipples appear to be flat. Baby latched well. Discussed w/mom to supplement d/t 10% weight loss, since mom's breast are filling, encouraged to use BM. Encouraged to pump and massage. Gave mom supplementing feeding sheet, explained to give colostrum first, then supplement w/formula if doesn't have to amount needed.  Asked RN to set up DEBP.  Mom states understands and is ok w/supplementing,. Patient Name: Shannon Frye ZOXWR'UToday's Date: 06/29/2017 Reason for consult: Follow-up assessment;Hyperbilirubinemia   Maternal Data Has patient been taught Hand Expression?: Yes Does the patient have breastfeeding experience prior to this delivery?: No  Feeding Feeding Type: Breast Fed Length of feed: (S) (still BF)  LATCH Score Latch: Grasps breast easily, tongue down, lips flanged, rhythmical sucking.  Audible Swallowing: Spontaneous and intermittent  Type of Nipple: Flat  Comfort (Breast/Nipple): Filling, red/small blisters or bruises, mild/mod discomfort  Hold (Positioning): No assistance needed to correctly position infant at breast.  LATCH Score: 8  Interventions Interventions: Breast feeding basics reviewed;Breast massage;Support pillows  Lactation Tools Discussed/Used     Consult Status Consult Status: Follow-up Date: 06/30/17 Follow-up type: In-patient    Karel Turpen, Diamond NickelLAURA G 06/29/2017, 7:22 AM

## 2017-06-29 NOTE — Discharge Instructions (Signed)
Postpartum Care After Vaginal Delivery °The period of time right after you deliver your newborn is called the postpartum period. °What kind of medical care will I receive? °· You may continue to receive fluids and medicines through an IV tube inserted into one of your veins. °· If an incision was made near your vagina (episiotomy) or if you had some vaginal tearing during delivery, cold compresses may be placed on your episiotomy or your tear. This helps to reduce pain and swelling. °· You may be given a squirt bottle to use when you go to the bathroom. You may use this until you are comfortable wiping as usual. To use the squirt bottle, follow these steps: °? Before you urinate, fill the squirt bottle with warm water. Do not use hot water. °? After you urinate, while you are sitting on the toilet, use the squirt bottle to rinse the area around your urethra and vaginal opening. This rinses away any urine and blood. °? You may do this instead of wiping. As you start healing, you may use the squirt bottle before wiping yourself. Make sure to wipe gently. °? Fill the squirt bottle with clean water every time you use the bathroom. °· You will be given sanitary pads to wear. °How can I expect to feel? °· You may not feel the need to urinate for several hours after delivery. °· You will have some soreness and pain in your abdomen and vagina. °· If you are breastfeeding, you may have uterine contractions every time you breastfeed for up to several weeks postpartum. Uterine contractions help your uterus return to its normal size. °· It is normal to have vaginal bleeding (lochia) after delivery. The amount and appearance of lochia is often similar to a menstrual period in the first week after delivery. It will gradually decrease over the next few weeks to a dry, yellow-brown discharge. For most women, lochia stops completely by 6-8 weeks after delivery. Vaginal bleeding can vary from woman to woman. °· Within the first few  days after delivery, you may have breast engorgement. This is when your breasts feel heavy, full, and uncomfortable. Your breasts may also throb and feel hard, tightly stretched, warm, and tender. After this occurs, you may have milk leaking from your breasts. Your health care provider can help you relieve discomfort due to breast engorgement. Breast engorgement should go away within a few days. °· You may feel more sad or worried than normal due to hormonal changes after delivery. These feelings should not last more than a few days. If these feelings do not go away after several days, speak with your health care provider. °How should I care for myself? °· Tell your health care provider if you have pain or discomfort. °· Drink enough water to keep your urine clear or pale yellow. °· Wash your hands thoroughly with soap and water for at least 20 seconds after changing your sanitary pads, after using the toilet, and before holding or feeding your baby. °· If you are not breastfeeding, avoid touching your breasts a lot. Doing this can make your breasts produce more milk. °· If you become weak or lightheaded, or you feel like you might faint, ask for help before: °? Getting out of bed. °? Showering. °· Change your sanitary pads frequently. Watch for any changes in your flow, such as a sudden increase in volume, a change in color, the passing of large blood clots. If you pass a blood clot from your vagina, save it   to show to your health care provider. Do not flush blood clots down the toilet without having your health care provider look at them. °· Make sure that all your vaccinations are up to date. This can help protect you and your baby from getting certain diseases. You may need to have immunizations done before you leave the hospital. °· If desired, talk with your health care provider about methods of family planning or birth control (contraception). °How can I start bonding with my baby? °Spending as much time as  possible with your baby is very important. During this time, you and your baby can get to know each other and develop a bond. Having your baby stay with you in your room (rooming in) can give you time to get to know your baby. Rooming in can also help you become comfortable caring for your baby. Breastfeeding can also help you bond with your baby. °How can I plan for returning home with my baby? °· Make sure that you have a car seat installed in your vehicle. °? Your car seat should be checked by a certified car seat installer to make sure that it is installed safely. °? Make sure that your baby fits into the car seat safely. °· Ask your health care provider any questions you have about caring for yourself or your baby. Make sure that you are able to contact your health care provider with any questions after leaving the hospital. °This information is not intended to replace advice given to you by your health care provider. Make sure you discuss any questions you have with your health care provider. °Document Released: 02/17/2007 Document Revised: 09/25/2015 Document Reviewed: 03/27/2015 °Elsevier Interactive Patient Education © 2018 [employer] ° °

## 2017-07-01 ENCOUNTER — Ambulatory Visit: Payer: Self-pay

## 2017-07-01 NOTE — Lactation Note (Signed)
This note was copied from a baby's chart. Lactation Consultation Note  Patient Name: Shannon Frye BJYNW'GToday's Date: 07/01/2017 Reason for consult: Follow-up assessment;1st time breastfeeding;Term;Hyperbilirubinemia   Follow up with mom of 4 day old infant. Infant remains under phototherapy. Infant with great output and weight gain of 4 ounces in the last 24 hours. Mom reports she feels infant is still hungry after feeding, enc mom to increase feeds as infant desires, mom says she was glad I told her it was ok.   Mom reports pumping is going well. She has enough milk for feeding infant and extra in the fridge. Mom plans to continue pumping and bottle feeding. Mom reports breast are not hard and firm and are softening with pumping. [facility]   [facility] need [facility] at this time. Mom reports no questions/concerns at this time. Mom to call out for assistance as needed.    Maternal Data Formula Feeding for Exclusion: No Has patient been taught Hand Expression?: Yes Does the patient have breastfeeding experience prior to this delivery?: No  Feeding Feeding Type: Bottle Fed - Breast Milk Nipple Type: Slow - flow  LATCH Score                   Interventions    Lactation Tools Discussed/Used WIC Program: Yes Initiated by:: reviewed and encouraged   Consult Status Consult Status: Follow-up Date: 07/02/17 Follow-up type: In-patient    Shannon FloodSharon S Roselyn Frye 07/01/2017, 1:45 PM

## 2017-07-02 ENCOUNTER — Ambulatory Visit: Payer: Self-pay

## 2017-07-02 NOTE — Lactation Note (Signed)
This note was copied from a baby's chart. Lactation Consultation Note  Patient Name: Shannon Edyth GunnelsBrianna Frye HQION'GToday's Date: 07/02/2017   [facility] , and [facility] .  Mom aware WIC  should call her to set up a appt. To obtain her DEBP.  Mom also aware [facility]  LC reviewed instructions on the use of the DEBP.    Maternal Data    Feeding    LATCH Score                   Interventions    Lactation Tools Discussed/Used     Consult Status      Shannon SprangMargaret Ann Frye [name] 07/02/2017, 1:47 PM

## 2017-07-03 ENCOUNTER — Inpatient Hospital Stay (HOSPITAL_COMMUNITY): Admission: RE | Admit: 2017-07-03 | Payer: Medicaid Other | Source: Ambulatory Visit

## 2017-08-25 ENCOUNTER — Ambulatory Visit: Payer: Medicaid Other | Admitting: Obstetrics

## 2017-09-02 ENCOUNTER — Ambulatory Visit: Payer: Medicaid Other | Admitting: Obstetrics

## 2017-09-04 ENCOUNTER — Telehealth: Payer: Self-pay | Admitting: *Deleted

## 2017-09-04 NOTE — Telephone Encounter (Signed)
Lft vmail for patient to callback and reschedule missed Postpartum visit on 09/02/2017.

## 2017-09-08 ENCOUNTER — Encounter: Payer: Self-pay | Admitting: Obstetrics

## 2017-09-08 ENCOUNTER — Ambulatory Visit (INDEPENDENT_AMBULATORY_CARE_PROVIDER_SITE_OTHER): Payer: Medicaid Other | Admitting: Obstetrics

## 2017-09-08 DIAGNOSIS — Z3009 Encounter for other general counseling and advice on contraception: Secondary | ICD-10-CM | POA: Diagnosis not present

## 2017-09-08 DIAGNOSIS — Z30011 Encounter for initial prescription of contraceptive pills: Secondary | ICD-10-CM

## 2017-09-08 MED ORDER — LEVONORGEST-ETH ESTRADIOL-IRON 0.1-20 MG-MCG(21) PO TABS: 1.0000 | ORAL_TABLET | Freq: Every day | ORAL | 11 refills | Status: AC

## 2017-09-08 NOTE — Progress Notes (Signed)
.  Post Partum Exam  Shannon Frye is a 27 y.o. G4P1011 female who presents for a postpartum visit. She is 10 weeks postpartum following a spontaneous vaginal delivery. I have fully reviewed the prenatal and intrapartum course. The delivery was at 40 gestational weeks.  Anesthesia: epidural. Postpartum course has been unremarkable. Baby's course has been unremarkable. Baby is feeding by bottle - Gerber Gentle. Bleeding no bleeding. Bowel function is normal. Bladder function is normal. Patient is not sexually active. Contraception method is none. Postpartum depression screening:neg  The following portions of the patient's history were reviewed and updated as appropriate: allergies, current medications, past family history, past medical history, past social history, past surgical history and problem list. Last pap smear done 01/23/2017 and was Normal  Review of Systems A comprehensive review of systems was negative.    Objective:  Last menstrual period 09/19/2016, unknown if currently breastfeeding.  General:  alert and no distress   Breasts:  inspection negative, no nipple discharge or bleeding, no masses or nodularity palpable  Lungs: clear to auscultation bilaterally  Heart:  regular rate and rhythm, S1, S2 normal, no murmur, click, rub or gallop  Abdomen: soft, non-tender; bowel sounds normal; no masses,  no organomegaly   Vulva:  normal  Vagina: normal vagina  Cervix:  no cervical motion tenderness  Corpus: normal size, contour, position, consistency, mobility, non-tender  Adnexa:  no mass, fullness, tenderness  Rectal Exam: Not performed.        Assessment:   1. Postpartum care following vaginal delivery - doing well  2. Encounter for other general counseling and advice on contraception - wants OCP's  3. Encounter for initial prescription of contraceptive pills Rx: - Levonorgest-Eth Estrad-Fe Bisg (BALCOLTRA) 0.1-20 MG-MCG(21) TABS; Take 1 tablet by mouth daily before  breakfast.  Dispense: 28 tablet; Refill: 11   Plan:   1. Contraception: OCP (estrogen/progesterone) 2. Balcoltra Rx 3. Follow up in: 5 months or as needed.    Brock Bad MD 09-08-2017

## 2018-10-23 IMAGING — US US MFM OB COMP +14 WKS
1 series · 14 of 28 positions shown · non-contrast
Comparison: none

[Series 1: us mfm ob comp +14 wks · 14 of 86 slices shown]
[im 4/86]
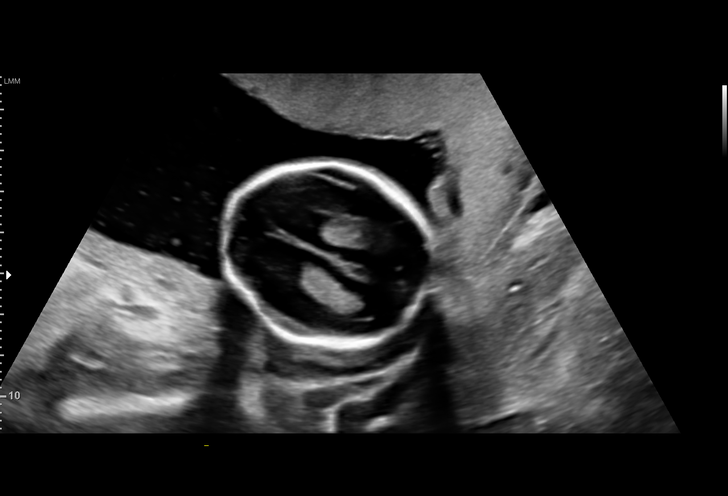
[im 10/86]
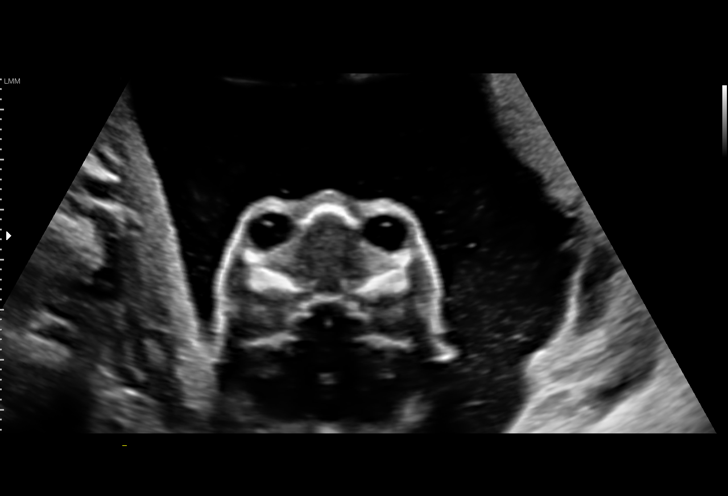
[im 16/86]
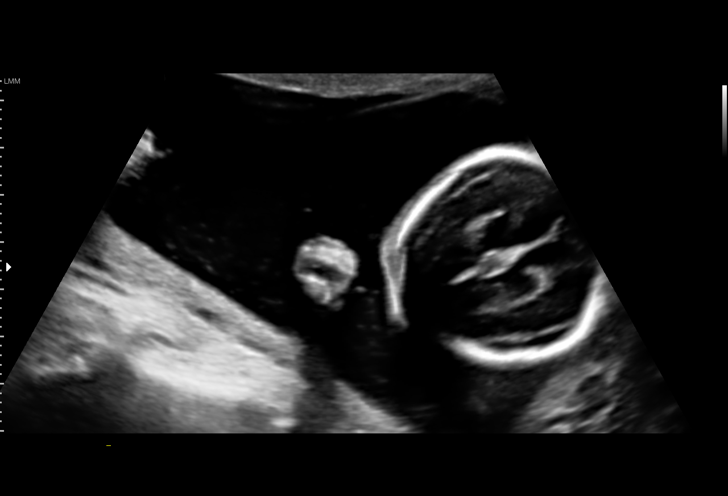
[im 23/86]
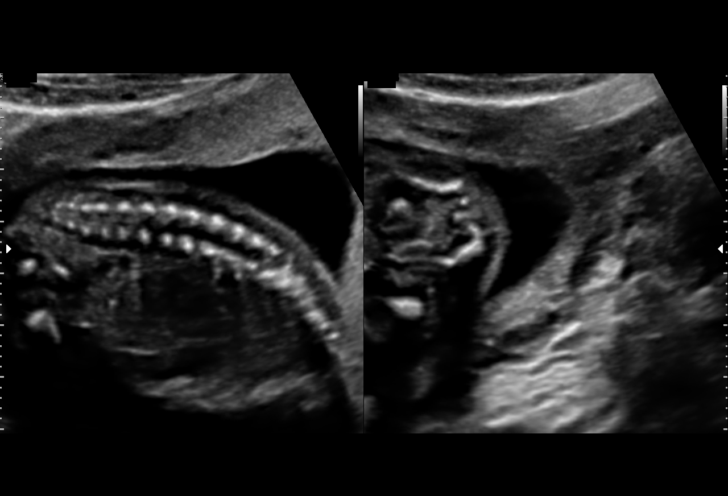
[im 29/86]
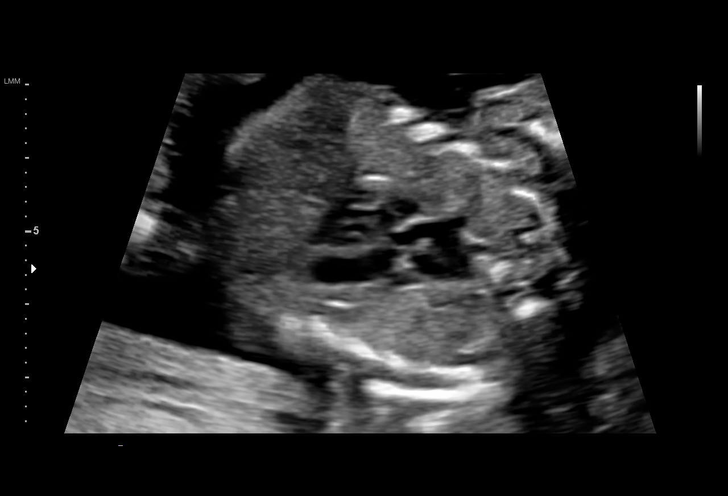
[im 35/86]
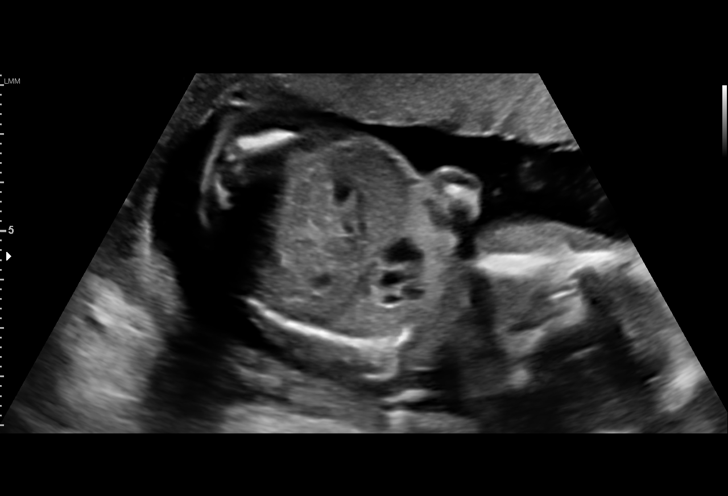
[im 41/86]
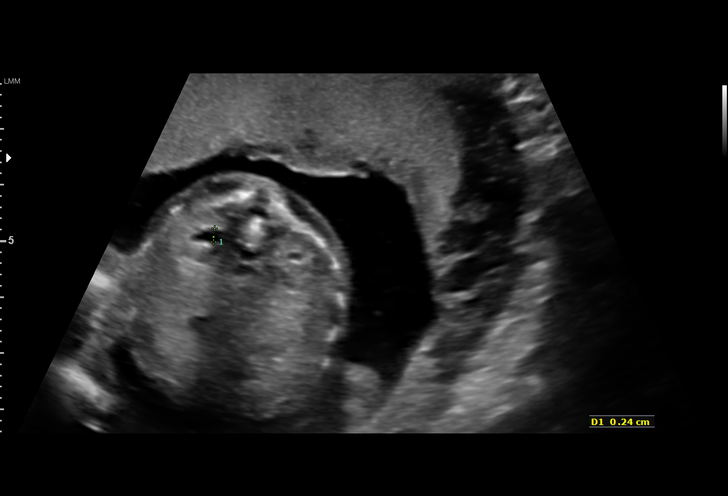
[im 48/86]
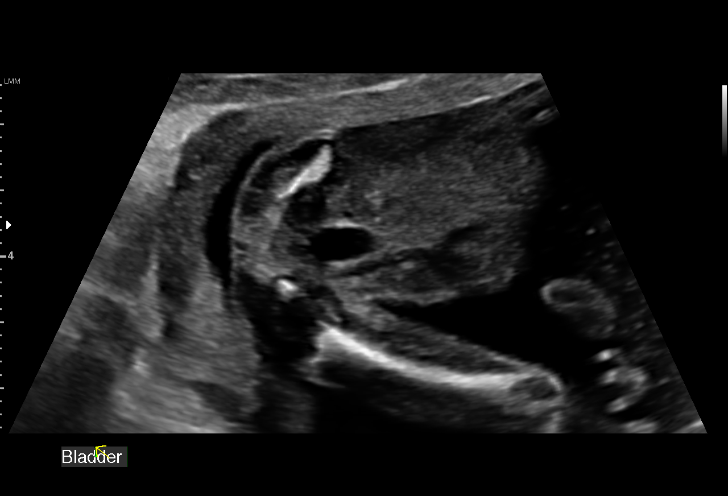
[im 54/86]
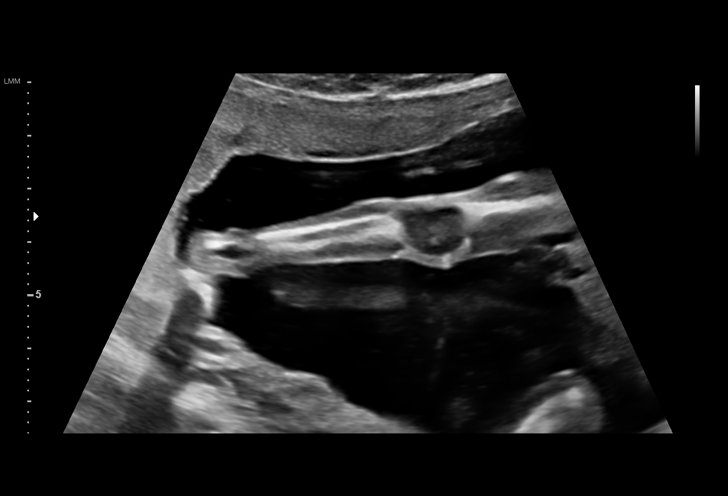
[im 60/86]
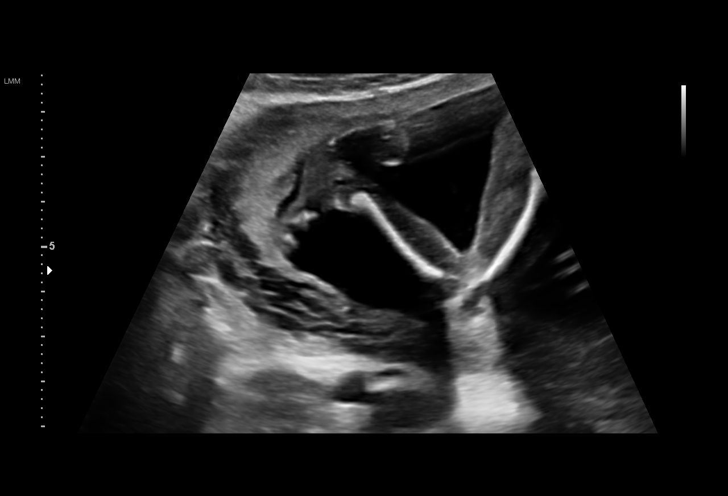
[im 67/86]
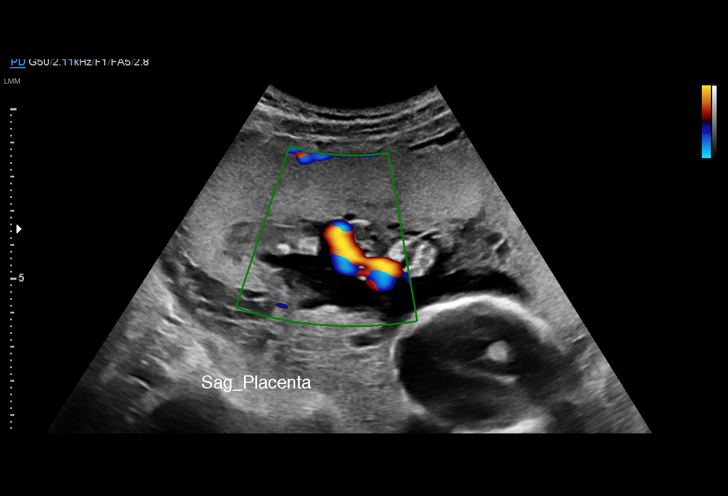
[im 73/86]
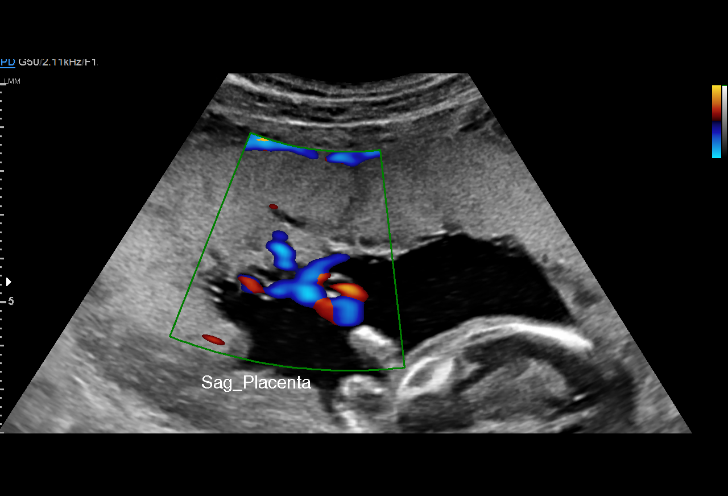
[im 79/86]
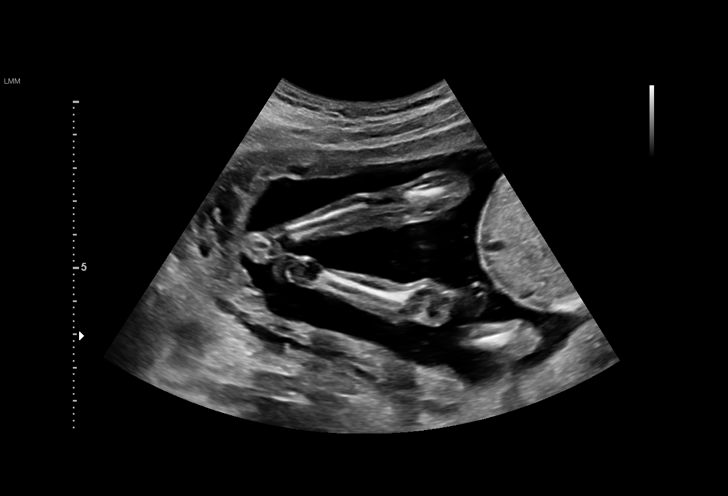
[im 86/86]
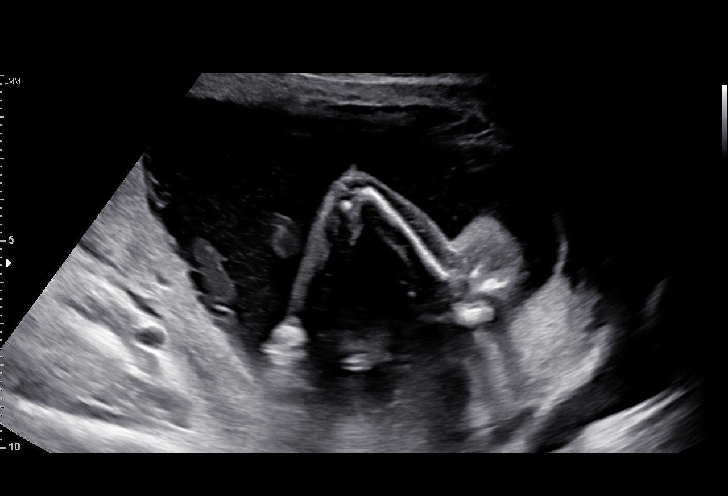

[14 of 28 positions shown; findings below may reference images not displayed]

1  ILL [facility]           [id number]      [id number]     [id number]
Indications

19 weeks gestation of pregnancy
Encounter for fetal anatomic survey
OB History

Gravidity:    2
TOP:          1        Living:  0
Fetal Evaluation

Num Of Fetuses:     1
Fetal Heart         150
Rate(bpm):
Cardiac Activity:   Observed
Presentation:       Cephalic
Placenta:           Left lateral, above cervical os
P. Cord Insertion:  Visualized, central

Amniotic Fluid
AFI FV:      Subjectively within normal limits

Largest Pocket(cm)
5.4
Biometry

BPD:      44.5  mm     G. Age:  19w 3d         64  %    CI:        76.75   %   70 - 86
FL/HC:      18.1   %   16.1 -
HC:      160.9  mm     G. Age:  18w 6d         31  %    HC/AC:      1.10       1.09 -
AC:      146.1  mm     G. Age:  19w 6d         71  %    FL/BPD:     65.4   %
FL:       29.1  mm     G. Age:  19w 0d         37  %    FL/AC:      19.9   %   20 - 24
HUM:      28.9  mm     G. Age:  19w 3d         57  %
CER:      19.2  mm     G. Age:  18w 4d         35  %
CM:        4.5  mm

Est. FW:     291  gm    0 lb 10 oz      50  %
Gestational Age

LMP:           19w 1d       Date:   09/19/16                 EDD:   06/26/17
U/S Today:     19w 2d                                        EDD:   06/25/17
Best:          19w 1d    Det. By:   LMP  (09/19/16)          EDD:   06/26/17
Anatomy

Cranium:               Appears normal         Aortic Arch:            Appears normal
Cavum:                 Appears normal         Ductal Arch:            Appears normal
Ventricles:            Appears normal         Diaphragm:              Appears normal
Choroid Plexus:        Appears normal         Stomach:                Appears normal, left
sided
Cerebellum:            Appears normal         Abdomen:                Appears normal
Posterior Fossa:       Appears normal         Abdominal Wall:         Appears nml (cord
insert, abd wall)
Nuchal Fold:           Not well visualized    Cord Vessels:           2 vessel cord,
absent Idalia Horace
Face:                  Orbits appear          Kidneys:                Appear normal
normal
Lips:                  Appears normal         Bladder:                Appears normal
Thoracic:              Appears normal         Spine:                  Appears normal
Heart:                 Appears normal         Upper Extremities:      Appears normal
(4CH, axis, and situs
RVOT:                  Appears normal         Lower Extremities:      Appears normal
LVOT:                  Appears normal

Other:  Fetus appears to be a female. Heels and 5th digit appears normal.
Technically difficult due to fetal position.
Cervix Uterus Adnexa

Cervix
Length:              4  cm.
Normal appearance by transabdominal scan.

Uterus
No abnormality visualized.

Left Ovary
Not visualized.

Right Ovary
Not visualized.

Cul De Sac:   No free fluid seen.

Adnexa:       No abnormality visualized.
Impression

Singleton intrauterine pregnancy at 19 weeks 1 day gestation
with fetal cardiac activity
Cephalic presentation
Question of bilobed placenta with insertion between
Normal appearing fetal growth and amniotic fluid
Single umbilical artery
No other apparent birth defects noted
Recommendations

Recommend follow-up ultrasound examination in 
 6 weeks
for re evaluation of placental insertion site and interval fetal
growth

## 2018-12-04 IMAGING — US US MFM OB FOLLOW-UP
1 series · 14 of 28 positions shown · non-contrast
Comparison: none

[Series 1: us mfm ob follow-up · 56 acquisitions, 14 frames shown]
[im 3/56]
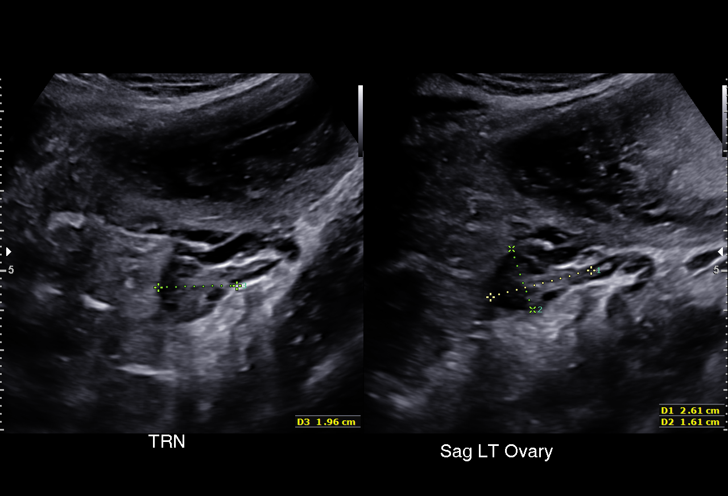
[im 7/56]
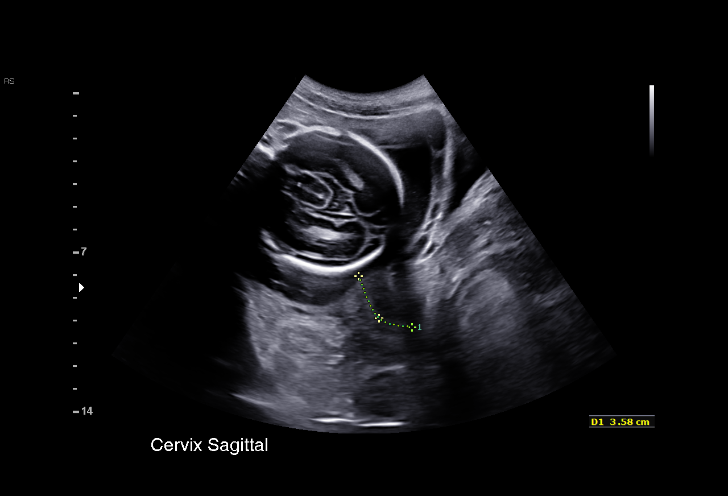
[im 11/56]
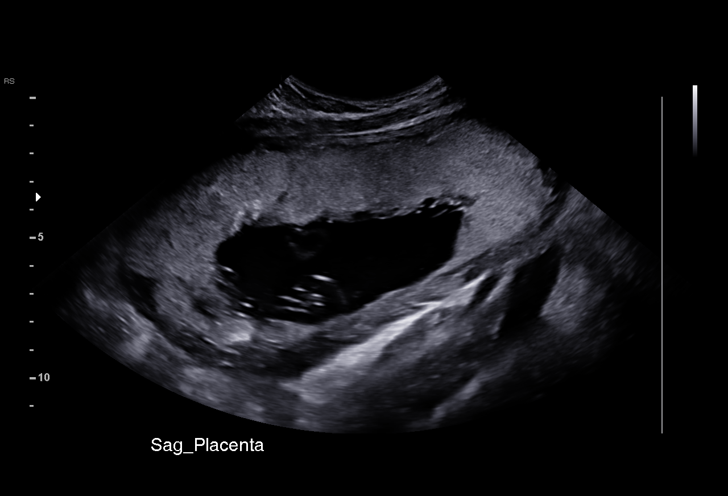
[im 15/56]
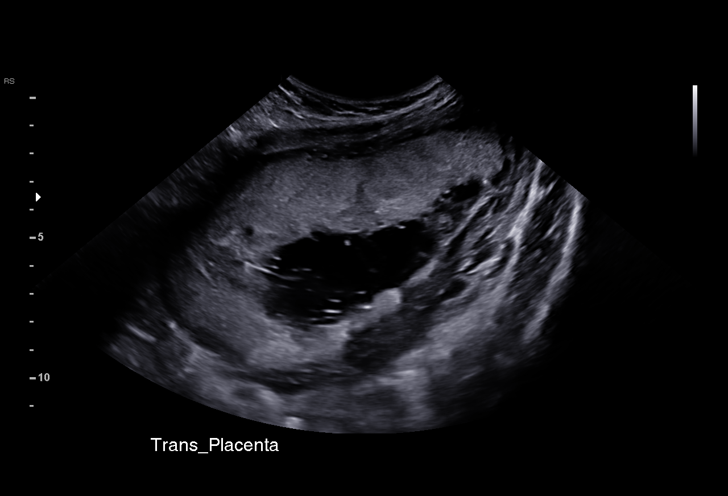
[im 19/56]
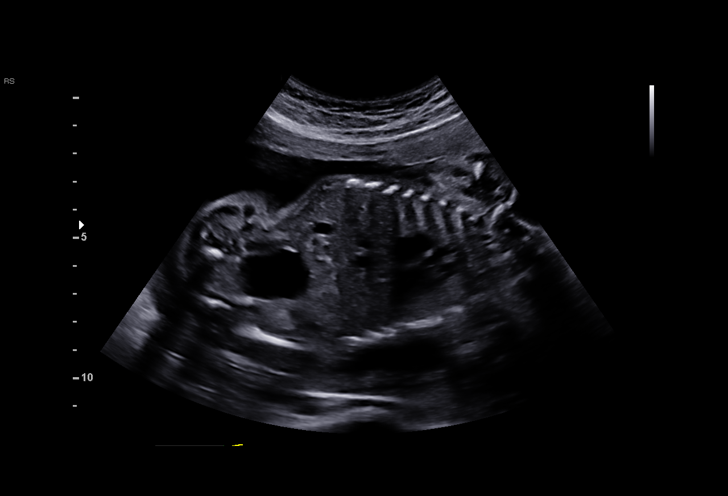
[im 23/56]
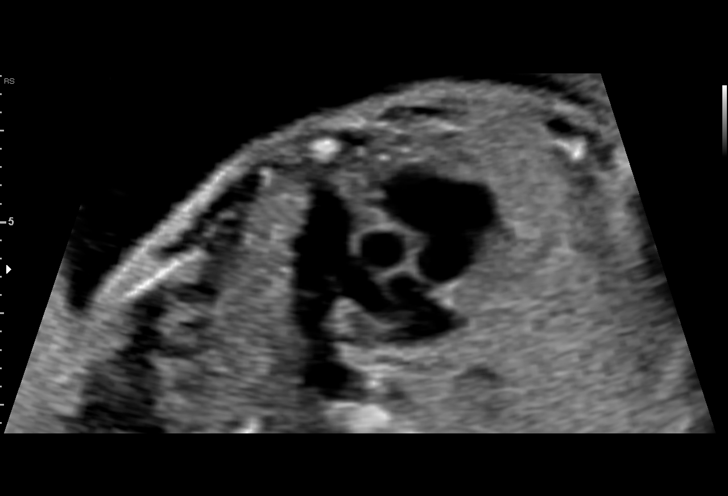
[im 27/56]
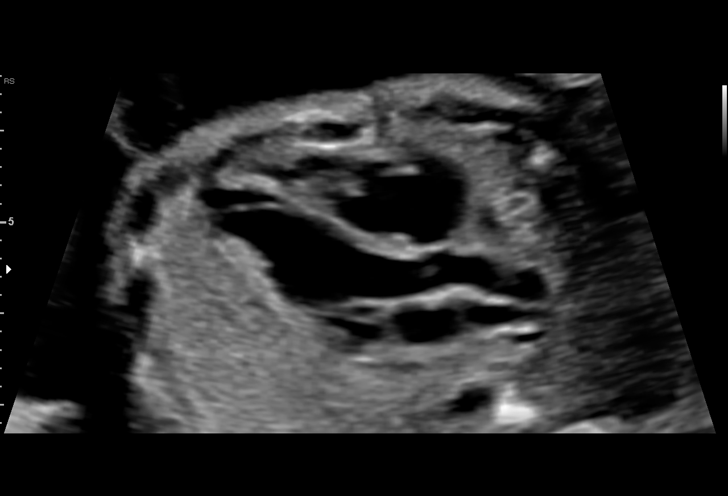
[im 31/56]
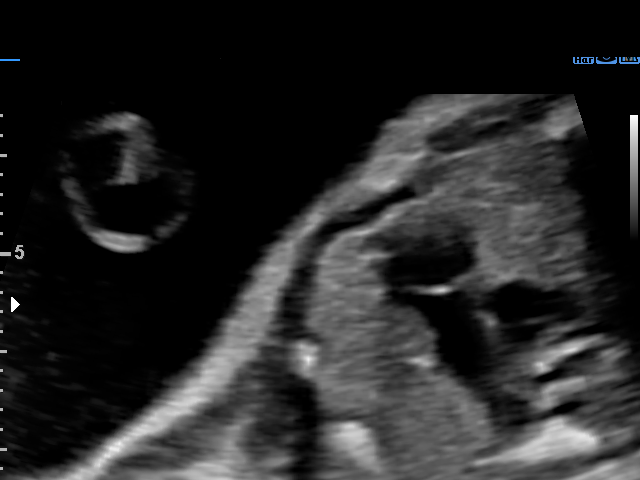
[im 35/56]
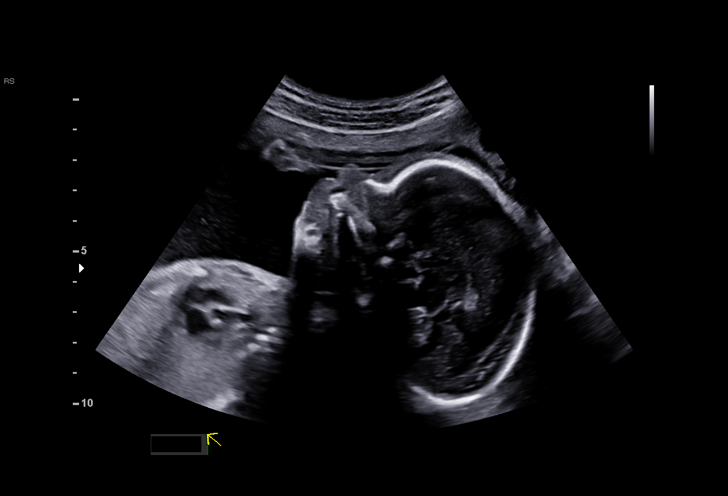
[im 39/56]
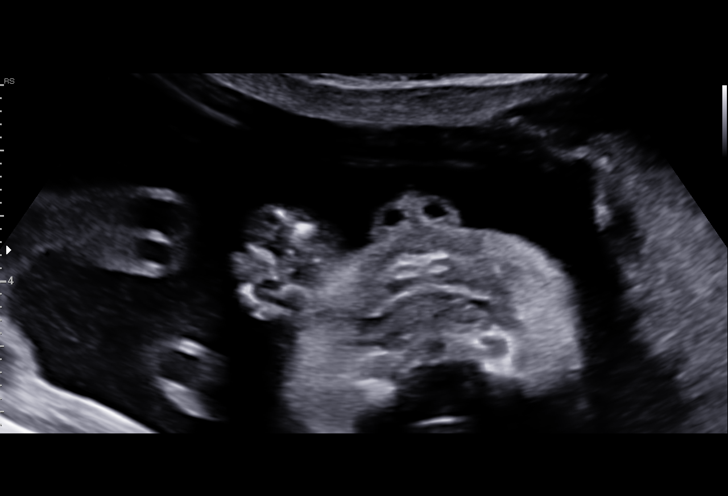
[im 43/56]
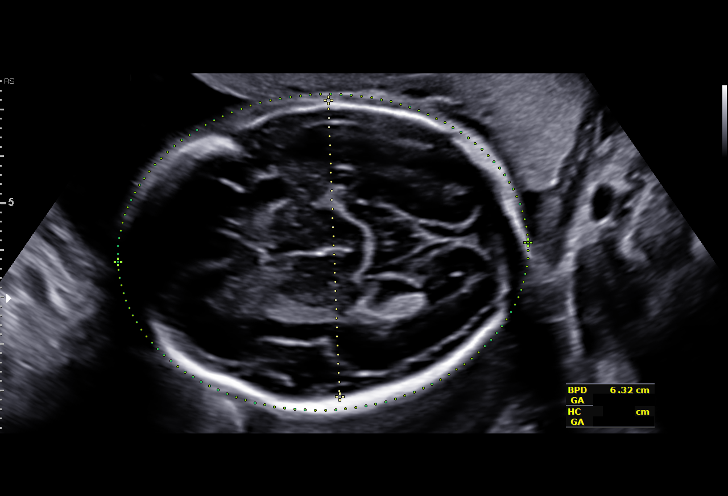
[im 47/56]
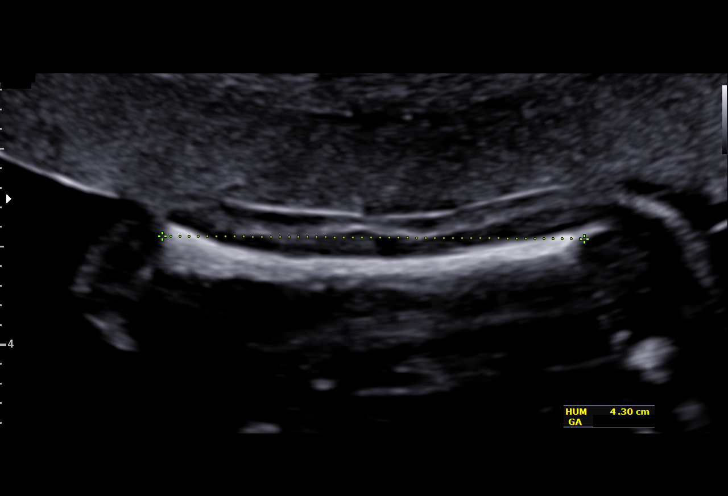
[im 51/56]
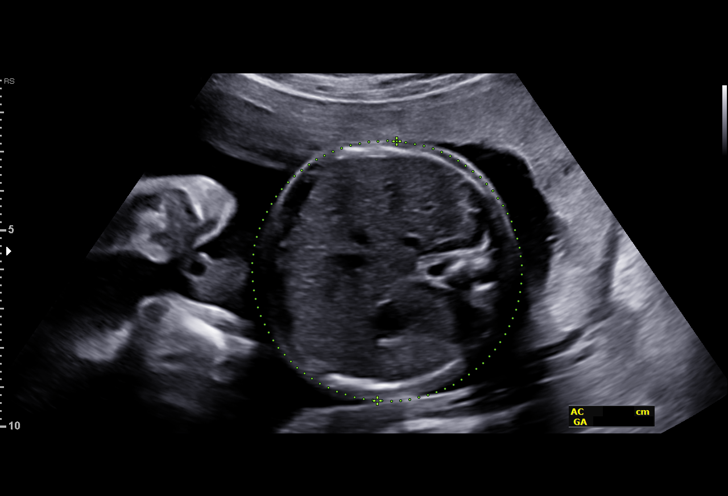
[im 56/56]
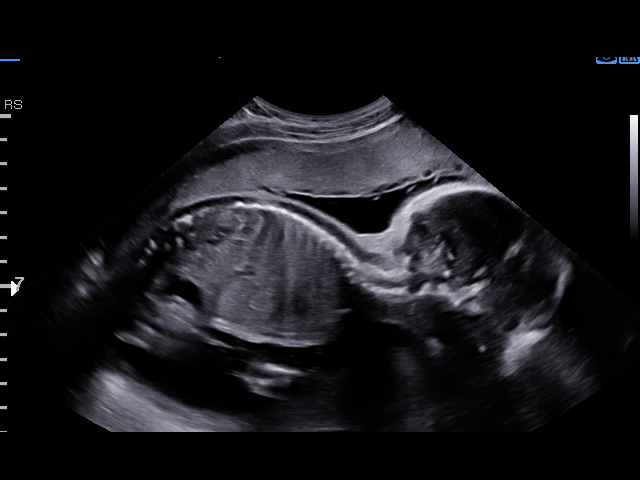

[14 of 28 positions shown; findings below may reference images not displayed]

1  SIDABRINIS TOTULIENE           [id number]      [id number]     [id number]
Indications

25 weeks gestation of pregnancy
Encounter for other antenatal screening
follow-up
2 vessel umbilical cord
OB History

Gravidity:    2
TOP:          1        Living:  0
Fetal Evaluation

Num Of Fetuses:     1
Fetal Heart         155
Rate(bpm):
Cardiac Activity:   Observed
Presentation:       Cephalic
Placenta:           Anterior, above cervical os
P. Cord Insertion:  Previously Visualized

Amniotic Fluid
AFI FV:      Subjectively upper-normal

Largest Pocket(cm)
7.75
Biometry

BPD:      62.9  mm     G. Age:  25w 3d         54  %    CI:        69.28   %    70 - 86
FL/HC:      19.1   %    18.7 -
HC:      241.3  mm     G. Age:  26w 2d         66  %    HC/AC:      1.16        1.04 -
AC:      208.7  mm     G. Age:  25w 3d         50  %    FL/BPD:     73.3   %    71 - 87
FL:       46.1  mm     G. Age:  25w 2d         42  %    FL/AC:      22.1   %    20 - 24
HUM:      42.6  mm     G. Age:  25w 4d         54  %

Est. FW:     812  gm    1 lb 13 oz      59  %
Gestational Age

LMP:           25w 1d        Date:  09/19/16                 EDD:   06/26/17
U/S Today:     25w 4d                                        EDD:   06/23/17
Best:          25w 1d     Det. By:  LMP  (09/19/16)          EDD:   06/26/17
Anatomy

Cranium:               Appears normal         Aortic Arch:            Appears normal
Cavum:                 Previously seen        Ductal Arch:            Appears normal
Ventricles:            Appears normal         Diaphragm:              Previously seen
Choroid Plexus:        Previously seen        Stomach:                Appears normal, left
sided
Cerebellum:            Previously seen        Abdomen:                Appears normal
Posterior Fossa:       Previously seen        Abdominal Wall:         Previously seen
Nuchal Fold:           Not applicable (>20    Cord Vessels:           2 vessel cord,
wks GA)
absent Prathima Lafontaine
Face:                  Profile appears        Kidneys:                Appear normal
normal,orbits prev
vis
Lips:                  Previously seen        Bladder:                Appears normal
Thoracic:              Appears normal         Spine:                  Previously seen
Heart:                 Appears normal         Upper Extremities:      Previously seen
(4CH, axis, and situs
RVOT:                  Appears normal         Lower Extremities:      Previously seen
LVOT:                  Appears normal

Other:  Fetus appears to be a female. Heels and 5th digit previously.
Technically difficult due to fetal position.
Cervix Uterus Adnexa

Cervix
Length:           3.58  cm.
Normal appearance by transabdominal scan.

Uterus
No abnormality visualized.

Left Ovary
Within normal limits.

Right Ovary
Not visualized.

Adnexa:       No abnormality visualized. No adnexal mass
visualized.
Impression

Singleton intrauterine pregnancy at 25 weeks 1 day gestation
with fetal cardiac activity
Cephalic presentation
Anterior placenta
Normal appearing fetal growth and amniotic fluid volume
Normal appearing cervical length
Recommendations

Follow-up ultrasounds as clinically indicated.

## 2019-03-17 ENCOUNTER — Inpatient Hospital Stay (HOSPITAL_COMMUNITY)
Admission: AD | Admit: 2019-03-17 | Discharge: 2019-03-17 | Disposition: A | Discharge status: Home / Self Care | Payer: Medicaid Other | Attending: Obstetrics and Gynecology | Admitting: Obstetrics and Gynecology

## 2019-03-17 ENCOUNTER — Other Ambulatory Visit: Payer: Self-pay

## 2019-03-17 DIAGNOSIS — R109 Unspecified abdominal pain: Secondary | ICD-10-CM | POA: Insufficient documentation

## 2019-03-17 DIAGNOSIS — Z3202 Encounter for pregnancy test, result negative: Secondary | ICD-10-CM | POA: Insufficient documentation

## 2019-03-17 LAB — POCT PREGNANCY, URINE: Preg Test, Ur: NEGATIVE

## 2019-03-17 NOTE — MAU Provider Note (Signed)
Patient Shannon Frye is a 28 y.o. G2P1011  at Unknown here with complaints of abdominal pain for a week. She does not want to be pregnant, but worries that she is pregnant. She is not using birth control. She would like a pregnancy test and STI testing.  She denies NV, constipation, dysuria.   UPT is negative; advised patient to go to the [facility] for birth control and STI testing.  All questions answered.   Mervyn Skeeters Abundio Teuscher 03/17/2019, 10:02 AM

## 2019-03-17 NOTE — MAU Note (Signed)
Presents with c/o abdominal pain that began last week.  Denies VB.  LMP 02/20/2019.  Hasn't taken a UPT.

## 2020-06-02 ENCOUNTER — Other Ambulatory Visit: Payer: Medicaid Other

## 2020-06-02 DIAGNOSIS — Z20822 Contact with and (suspected) exposure to covid-19: Secondary | ICD-10-CM

## 2020-06-03 LAB — NOVEL CORONAVIRUS, NAA: SARS-CoV-2, NAA: NOT DETECTED

## 2020-06-03 LAB — SARS-COV-2, NAA 2 DAY TAT

## 2022-05-13 ENCOUNTER — Other Ambulatory Visit: Payer: Self-pay

## 2022-05-13 ENCOUNTER — Emergency Department (HOSPITAL_COMMUNITY)
Admission: EM | Admit: 2022-05-13 | Discharge: 2022-05-14 | Discharge status: Against Medical Advice | Payer: 59 | Attending: Emergency Medicine | Admitting: Emergency Medicine

## 2022-05-13 DIAGNOSIS — N926 Irregular menstruation, unspecified: Secondary | ICD-10-CM | POA: Diagnosis not present

## 2022-05-13 DIAGNOSIS — Z5321 Procedure and treatment not carried out due to patient leaving prior to being seen by health care provider: Secondary | ICD-10-CM | POA: Diagnosis not present

## 2022-05-13 LAB — URINALYSIS, ROUTINE W REFLEX MICROSCOPIC
Bacteria, UA: NONE SEEN
Bilirubin Urine: NEGATIVE
Glucose, UA: NEGATIVE mg/dL
Ketones, ur: NEGATIVE mg/dL
Nitrite: NEGATIVE
Protein, ur: NEGATIVE mg/dL
RBC / HPF: >50 RBC/hpf — ABNORMAL HIGH (ref 0–5)
Specific Gravity, Urine: 1.024 (ref 1.005–1.030)
pH: 5 (ref 5.0–8.0)

## 2022-05-13 NOTE — ED Triage Notes (Signed)
Patient reports irregular menstrual cycle with heavy bleeding and intermittent low abdominal cramping .

## 2022-05-13 NOTE — ED Provider Triage Note (Signed)
  Emergency Medicine Provider Triage Evaluation Note  MRN:  [id number]  Arrival date & time: 05/14/22    Medically screening exam initiated at 2:40 AM.   CC:   Irregular Bleeding  HPI:  Shannon Frye is a 32 y.o. year-old female presents to the ED with chief complaint of irregular bleeding for the past 3 weeks.  Has been using about 5 tampons per day.  No longer having pain, but did have pain initially.    History provided by patient. ROS:  -As included in HPI PE:   Vitals:   05/13/22 2317  BP: 131/85  Pulse: 64  Resp: 16  Temp: 98 F (36.7 C)  SpO2: 99%    Non-toxic appearing No respiratory distress  MDM:  Based on signs and symptoms, abnormal uterine bleeding is highest on my differential. I've ordered labs in triage to expedite lab/diagnostic workup.  Patient was informed that the remainder of the evaluation will be completed by another provider, this initial triage assessment does not replace that evaluation, and the importance of remaining in the ED until their evaluation is complete.    Montine Circle, PA-C 05/14/22 [phone]

## 2022-05-14 LAB — BASIC METABOLIC PANEL
Anion gap: 10 (ref 5–15)
BUN: 10 mg/dL (ref 6–20)
CO2: 23 mmol/L (ref 22–32)
Calcium: 8.7 mg/dL — ABNORMAL LOW (ref 8.9–10.3)
Chloride: 103 mmol/L (ref 98–111)
Creatinine, Ser: 0.82 mg/dL (ref 0.44–1.00)
GFR, Estimated: >60 mL/min (ref >60)
Glucose, Bld: 100 mg/dL — ABNORMAL HIGH (ref 70–99)
Potassium: 3.4 mmol/L — ABNORMAL LOW (ref 3.5–5.1)
Sodium: 136 mmol/L (ref 135–145)

## 2022-05-14 LAB — CBC WITH DIFFERENTIAL/PLATELET
Abs Immature Granulocytes: 0.01 10*3/uL (ref 0.00–0.07)
Basophils Absolute: 0 10*3/uL (ref 0.0–0.1)
Basophils Relative: 1 %
Eosinophils Absolute: 0.1 10*3/uL (ref 0.0–0.5)
Eosinophils Relative: 1 %
HCT: 34.8 % — ABNORMAL LOW (ref 36.0–46.0)
Hemoglobin: 11.9 g/dL — ABNORMAL LOW (ref 12.0–15.0)
Immature Granulocytes: 0 %
Lymphocytes Relative: 26 %
Lymphs Abs: 1.7 10*3/uL (ref 0.7–4.0)
MCH: 31 pg (ref 26.0–34.0)
MCHC: 34.2 g/dL (ref 30.0–36.0)
MCV: 90.6 fL (ref 80.0–100.0)
Monocytes Absolute: 0.4 10*3/uL (ref 0.1–1.0)
Monocytes Relative: 7 %
Neutro Abs: 4.3 10*3/uL (ref 1.7–7.7)
Neutrophils Relative %: 65 %
Platelets: 299 10*3/uL (ref 150–400)
RBC: 3.84 MIL/uL — ABNORMAL LOW (ref 3.87–5.11)
RDW: 12.4 % (ref 11.5–15.5)
WBC: 6.5 10*3/uL (ref 4.0–10.5)
nRBC: 0 % (ref 0.0–0.2)

## 2022-05-14 LAB — I-STAT BETA HCG BLOOD, ED (MC, WL, AP ONLY): I-stat hCG, quantitative: <5 m[IU]/mL (ref <5)

## 2022-05-14 NOTE — ED Notes (Signed)
Eloped

## 2023-06-22 ENCOUNTER — Other Ambulatory Visit: Payer: Self-pay

## 2023-06-22 ENCOUNTER — Encounter (HOSPITAL_BASED_OUTPATIENT_CLINIC_OR_DEPARTMENT_OTHER): Payer: Self-pay | Admitting: Radiology

## 2023-06-22 ENCOUNTER — Emergency Department (HOSPITAL_BASED_OUTPATIENT_CLINIC_OR_DEPARTMENT_OTHER)
Admission: EM | Admit: 2023-06-22 | Discharge: 2023-06-22 | Disposition: A | Discharge status: Home / Self Care | Payer: 59 | Attending: Emergency Medicine | Admitting: Emergency Medicine

## 2023-06-22 ENCOUNTER — Emergency Department (HOSPITAL_BASED_OUTPATIENT_CLINIC_OR_DEPARTMENT_OTHER): Payer: 59 | Admitting: Radiology

## 2023-06-22 DIAGNOSIS — R9389 Abnormal findings on diagnostic imaging of other specified body structures: Secondary | ICD-10-CM

## 2023-06-22 DIAGNOSIS — Y92410 Unspecified street and highway as the place of occurrence of the external cause: Secondary | ICD-10-CM | POA: Diagnosis not present

## 2023-06-22 DIAGNOSIS — M25511 Pain in right shoulder: Secondary | ICD-10-CM | POA: Diagnosis present

## 2023-06-22 DIAGNOSIS — M898X2 Other specified disorders of bone, upper arm: Secondary | ICD-10-CM | POA: Insufficient documentation

## 2023-06-22 MED ORDER — CYCLOBENZAPRINE HCL 10 MG PO TABS: 10.0000 mg | ORAL_TABLET | Freq: Two times a day (BID) | ORAL | 0 refills | Status: AC | PRN
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
## 2023-06-22 NOTE — Discharge Instructions (Addendum)
 You have been seen today for your complaint of right shoulder pain. Your imaging did not show any fractures but did show a small area that appears to be abnormal with recommendation to get another x-ray in 6 months. Your discharge medications include flexeril. This is a muscle relaxer. It may cause drowsiness. Do not drive, operate heavy machinery or make important decisions when taking this medication. Only take it at night until you know how it affects you. Only take it as needed and take other medications such as ibuprofen or tylenol prior to trying this medication.   Alternate tylenol and ibuprofen for pain. You may alternate these every 4 hours. You may take up to 800 mg of ibuprofen at a time and up to 1000 mg of tylenol. Follow up with: Your primary care provider Please seek immediate medical care if you develop any of the following symptoms: You have increasing pain in the chest, neck, back, or abdomen. You have shortness of breath. At this time there does not appear to be the presence of an emergent medical condition, however there is always the potential for conditions to change. Please read and follow the below instructions.  Do not take your medicine if  develop an itchy rash, swelling in your mouth or lips, or difficulty breathing; call 911 and seek immediate emergency medical attention if this occurs.  You may review your lab tests and imaging results in their entirety on your MyChart account.  Please discuss all results of fully with your primary care provider and other specialist at your follow-up visit.  Note: Portions of this text may have been transcribed using voice recognition software. Every effort was made to ensure accuracy; however, inadvertent computerized transcription errors may still be present.
